# Patient Record
Sex: Female | Born: 1980 | Race: Black or African American | Hispanic: No | Marital: Married | Smoking: Never smoker
Health system: Southern US, Community
[De-identification: ages and names within clinical notes are randomized; demographics above are authoritative.]

## PROBLEM LIST (undated history)

## (undated) DIAGNOSIS — E119 Type 2 diabetes mellitus without complications: Secondary | ICD-10-CM

---
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
## 2000-02-05 ENCOUNTER — Other Ambulatory Visit: Admission: RE | Admit: 2000-02-05 | Discharge: 2000-02-05 | Payer: Self-pay | Admitting: *Deleted

## 2000-05-24 ENCOUNTER — Other Ambulatory Visit: Admission: RE | Admit: 2000-05-24 | Discharge: 2000-05-24 | Payer: Self-pay | Admitting: *Deleted

## 2000-10-13 ENCOUNTER — Other Ambulatory Visit: Admission: RE | Admit: 2000-10-13 | Discharge: 2000-10-13 | Payer: Self-pay | Admitting: *Deleted

## 2001-02-02 ENCOUNTER — Other Ambulatory Visit: Admission: RE | Admit: 2001-02-02 | Discharge: 2001-02-02 | Payer: Self-pay | Admitting: *Deleted

## 2001-07-09 ENCOUNTER — Inpatient Hospital Stay (HOSPITAL_COMMUNITY): Admission: AD | Admit: 2001-07-09 | Discharge: 2001-07-09 | Payer: Self-pay | Admitting: Obstetrics and Gynecology

## 2001-10-25 ENCOUNTER — Other Ambulatory Visit: Admission: RE | Admit: 2001-10-25 | Discharge: 2001-10-25 | Payer: Self-pay | Admitting: Obstetrics & Gynecology

## 2012-06-12 ENCOUNTER — Other Ambulatory Visit: Payer: Self-pay | Admitting: *Deleted

## 2021-03-03 DIAGNOSIS — N96 Recurrent pregnancy loss: Secondary | ICD-10-CM | POA: Diagnosis not present

## 2021-03-03 DIAGNOSIS — E119 Type 2 diabetes mellitus without complications: Secondary | ICD-10-CM | POA: Diagnosis not present

## 2021-03-03 DIAGNOSIS — Z01419 Encounter for gynecological examination (general) (routine) without abnormal findings: Secondary | ICD-10-CM | POA: Diagnosis not present

## 2021-03-03 DIAGNOSIS — Z9889 Other specified postprocedural states: Secondary | ICD-10-CM | POA: Diagnosis not present

## 2021-03-27 DIAGNOSIS — M25532 Pain in left wrist: Secondary | ICD-10-CM | POA: Diagnosis not present

## 2021-03-27 DIAGNOSIS — M545 Low back pain, unspecified: Secondary | ICD-10-CM | POA: Diagnosis not present

## 2021-03-27 DIAGNOSIS — R1084 Generalized abdominal pain: Secondary | ICD-10-CM | POA: Diagnosis not present

## 2021-03-27 DIAGNOSIS — M79642 Pain in left hand: Secondary | ICD-10-CM | POA: Diagnosis not present

## 2021-03-27 DIAGNOSIS — R079 Chest pain, unspecified: Secondary | ICD-10-CM | POA: Diagnosis not present

## 2021-03-27 DIAGNOSIS — M542 Cervicalgia: Secondary | ICD-10-CM | POA: Diagnosis not present

## 2021-03-27 DIAGNOSIS — Z23 Encounter for immunization: Secondary | ICD-10-CM | POA: Diagnosis not present

## 2021-03-27 DIAGNOSIS — M25552 Pain in left hip: Secondary | ICD-10-CM | POA: Diagnosis not present

## 2021-03-27 DIAGNOSIS — Y999 Unspecified external cause status: Secondary | ICD-10-CM | POA: Diagnosis not present

## 2021-03-27 DIAGNOSIS — S79922A Unspecified injury of left thigh, initial encounter: Secondary | ICD-10-CM | POA: Diagnosis not present

## 2021-03-27 DIAGNOSIS — M79622 Pain in left upper arm: Secondary | ICD-10-CM | POA: Diagnosis not present

## 2021-03-27 DIAGNOSIS — S0990XA Unspecified injury of head, initial encounter: Secondary | ICD-10-CM | POA: Diagnosis not present

## 2021-03-27 DIAGNOSIS — M25522 Pain in left elbow: Secondary | ICD-10-CM | POA: Diagnosis not present

## 2021-03-27 DIAGNOSIS — M79632 Pain in left forearm: Secondary | ICD-10-CM | POA: Diagnosis not present

## 2021-03-27 DIAGNOSIS — Y92410 Unspecified street and highway as the place of occurrence of the external cause: Secondary | ICD-10-CM | POA: Diagnosis not present

## 2021-04-10 DIAGNOSIS — H40003 Preglaucoma, unspecified, bilateral: Secondary | ICD-10-CM | POA: Diagnosis not present

## 2021-04-10 DIAGNOSIS — M25512 Pain in left shoulder: Secondary | ICD-10-CM | POA: Diagnosis not present

## 2021-04-10 DIAGNOSIS — E1165 Type 2 diabetes mellitus with hyperglycemia: Secondary | ICD-10-CM | POA: Diagnosis not present

## 2021-04-13 DIAGNOSIS — Z3141 Encounter for fertility testing: Secondary | ICD-10-CM | POA: Diagnosis not present

## 2021-04-13 DIAGNOSIS — Z3202 Encounter for pregnancy test, result negative: Secondary | ICD-10-CM | POA: Diagnosis not present

## 2021-04-22 DIAGNOSIS — M25512 Pain in left shoulder: Secondary | ICD-10-CM | POA: Diagnosis not present

## 2021-04-22 DIAGNOSIS — M542 Cervicalgia: Secondary | ICD-10-CM | POA: Diagnosis not present

## 2021-05-20 DIAGNOSIS — M25512 Pain in left shoulder: Secondary | ICD-10-CM | POA: Diagnosis not present

## 2021-05-20 DIAGNOSIS — M542 Cervicalgia: Secondary | ICD-10-CM | POA: Diagnosis not present

## 2021-06-09 DIAGNOSIS — D251 Intramural leiomyoma of uterus: Secondary | ICD-10-CM | POA: Diagnosis not present

## 2021-06-09 DIAGNOSIS — Z31430 Encounter of female for testing for genetic disease carrier status for procreative management: Secondary | ICD-10-CM | POA: Diagnosis not present

## 2021-06-09 DIAGNOSIS — N856 Intrauterine synechiae: Secondary | ICD-10-CM | POA: Diagnosis not present

## 2021-06-09 DIAGNOSIS — N96 Recurrent pregnancy loss: Secondary | ICD-10-CM | POA: Diagnosis not present

## 2021-06-09 DIAGNOSIS — N979 Female infertility, unspecified: Secondary | ICD-10-CM | POA: Diagnosis not present

## 2021-06-09 DIAGNOSIS — Z319 Encounter for procreative management, unspecified: Secondary | ICD-10-CM | POA: Diagnosis not present

## 2021-06-09 DIAGNOSIS — E559 Vitamin D deficiency, unspecified: Secondary | ICD-10-CM | POA: Diagnosis not present

## 2021-06-26 DIAGNOSIS — M542 Cervicalgia: Secondary | ICD-10-CM | POA: Diagnosis not present

## 2021-06-26 DIAGNOSIS — M25512 Pain in left shoulder: Secondary | ICD-10-CM | POA: Diagnosis not present

## 2021-10-10 ENCOUNTER — Other Ambulatory Visit: Payer: Self-pay

## 2021-10-10 ENCOUNTER — Ambulatory Visit
Admission: EM | Admit: 2021-10-10 | Discharge: 2021-10-10 | Disposition: A | Discharge status: Home / Self Care | Payer: BC Managed Care – PPO | Attending: Urgent Care | Admitting: Urgent Care

## 2021-10-10 ENCOUNTER — Encounter: Payer: Self-pay | Admitting: Urgent Care

## 2021-10-10 DIAGNOSIS — L568 Other specified acute skin changes due to ultraviolet radiation: Secondary | ICD-10-CM | POA: Diagnosis not present

## 2021-10-10 DIAGNOSIS — G43809 Other migraine, not intractable, without status migrainosus: Secondary | ICD-10-CM | POA: Diagnosis not present

## 2021-10-10 HISTORY — DX: Type 2 diabetes mellitus without complications: E11.9

## 2021-10-10 MED ORDER — SUMATRIPTAN SUCCINATE 25 MG PO TABS
ORAL_TABLET | ORAL | 0 refills | Status: DC
Start: 2021-10-10 — End: 2023-04-22
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
## 2021-11-13 DIAGNOSIS — I10 Essential (primary) hypertension: Secondary | ICD-10-CM | POA: Diagnosis not present

## 2021-11-13 DIAGNOSIS — E1165 Type 2 diabetes mellitus with hyperglycemia: Secondary | ICD-10-CM | POA: Diagnosis not present

## 2021-11-13 DIAGNOSIS — E559 Vitamin D deficiency, unspecified: Secondary | ICD-10-CM | POA: Diagnosis not present

## 2021-12-28 DIAGNOSIS — E7800 Pure hypercholesterolemia, unspecified: Secondary | ICD-10-CM | POA: Diagnosis not present

## 2021-12-28 DIAGNOSIS — F064 Anxiety disorder due to known physiological condition: Secondary | ICD-10-CM | POA: Diagnosis not present

## 2021-12-28 DIAGNOSIS — Z20822 Contact with and (suspected) exposure to covid-19: Secondary | ICD-10-CM | POA: Diagnosis not present

## 2021-12-28 DIAGNOSIS — F4312 Post-traumatic stress disorder, chronic: Secondary | ICD-10-CM | POA: Diagnosis not present

## 2021-12-28 DIAGNOSIS — E1169 Type 2 diabetes mellitus with other specified complication: Secondary | ICD-10-CM | POA: Diagnosis not present

## 2021-12-28 DIAGNOSIS — I10 Essential (primary) hypertension: Secondary | ICD-10-CM | POA: Diagnosis not present

## 2022-01-04 ENCOUNTER — Emergency Department (HOSPITAL_BASED_OUTPATIENT_CLINIC_OR_DEPARTMENT_OTHER): Payer: BC Managed Care – PPO

## 2022-01-04 ENCOUNTER — Emergency Department (HOSPITAL_BASED_OUTPATIENT_CLINIC_OR_DEPARTMENT_OTHER): Payer: BC Managed Care – PPO | Admitting: Radiology

## 2022-01-04 ENCOUNTER — Emergency Department (HOSPITAL_BASED_OUTPATIENT_CLINIC_OR_DEPARTMENT_OTHER)
Admission: EM | Admit: 2022-01-04 | Discharge: 2022-01-04 | Disposition: A | Discharge status: Home / Self Care | Payer: BC Managed Care – PPO | Attending: Emergency Medicine | Admitting: Emergency Medicine

## 2022-01-04 ENCOUNTER — Other Ambulatory Visit: Payer: Self-pay

## 2022-01-04 ENCOUNTER — Encounter (HOSPITAL_BASED_OUTPATIENT_CLINIC_OR_DEPARTMENT_OTHER): Payer: Self-pay | Admitting: Obstetrics and Gynecology

## 2022-01-04 DIAGNOSIS — W19XXXA Unspecified fall, initial encounter: Secondary | ICD-10-CM

## 2022-01-04 DIAGNOSIS — M545 Low back pain, unspecified: Secondary | ICD-10-CM

## 2022-01-04 DIAGNOSIS — S39012A Strain of muscle, fascia and tendon of lower back, initial encounter: Secondary | ICD-10-CM

## 2022-01-04 DIAGNOSIS — W1830XA Fall on same level, unspecified, initial encounter: Secondary | ICD-10-CM | POA: Diagnosis not present

## 2022-01-04 DIAGNOSIS — M549 Dorsalgia, unspecified: Secondary | ICD-10-CM | POA: Diagnosis not present

## 2022-01-04 DIAGNOSIS — M25562 Pain in left knee: Secondary | ICD-10-CM | POA: Diagnosis not present

## 2022-01-04 DIAGNOSIS — M25531 Pain in right wrist: Secondary | ICD-10-CM | POA: Diagnosis not present

## 2022-01-04 DIAGNOSIS — R102 Pelvic and perineal pain: Secondary | ICD-10-CM | POA: Diagnosis not present

## 2022-01-04 DIAGNOSIS — S3992XA Unspecified injury of lower back, initial encounter: Secondary | ICD-10-CM | POA: Diagnosis not present

## 2022-01-04 DIAGNOSIS — M533 Sacrococcygeal disorders, not elsewhere classified: Secondary | ICD-10-CM | POA: Diagnosis not present

## 2022-01-04 DIAGNOSIS — N9489 Other specified conditions associated with female genital organs and menstrual cycle: Secondary | ICD-10-CM | POA: Diagnosis not present

## 2022-01-04 DIAGNOSIS — S79919A Unspecified injury of unspecified hip, initial encounter: Secondary | ICD-10-CM | POA: Diagnosis not present

## 2022-01-04 LAB — HCG, QUANTITATIVE, PREGNANCY: hCG, Beta Chain, Quant, S: <1 m[IU]/mL (ref <5)

## 2022-01-04 MED ORDER — LIDOCAINE 5 % EX PTCH: 1.0000 | MEDICATED_PATCH | CUTANEOUS | 0 refills | Status: DC

## 2022-01-04 MED ORDER — HYDROCODONE-ACETAMINOPHEN 5-325 MG PO TABS
1.0000 | ORAL_TABLET | Freq: Once | ORAL | Status: AC
  Administered 2022-01-04: 1 via ORAL
  Filled 2022-01-04: qty 1

## 2022-01-04 MED ORDER — IBUPROFEN 400 MG PO TABS: 400.0000 mg | ORAL_TABLET | Freq: Once | ORAL | Status: DC | PRN

## 2022-01-04 MED ORDER — IBUPROFEN 800 MG PO TABS
800.0000 mg | ORAL_TABLET | Freq: Once | ORAL | Status: AC | PRN
  Administered 2022-01-04: 800 mg via ORAL
  Filled 2022-01-04: qty 1

## 2022-01-04 NOTE — Discharge Instructions (Signed)
Your CT and x-ray imaging was negative for acute fracture or dislocation.  Your symptoms are likely consistent with musculoskeletal strain in your lower lumbar region.  Recommend rest, ice, heating pads, lidocaine patches, NSAIDs for pain control. ?
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
## 2022-01-04 NOTE — ED Notes (Signed)
Discharge paperwork given and understood. 

## 2022-01-04 NOTE — ED Notes (Signed)
Patient returned at this  Time from Imaging. ?

## 2022-01-04 NOTE — ED Triage Notes (Signed)
Patient reports to the ER for a fall at [employer]. Patient reports back pain and tailbone pain as well as right wrist pain. Patient reports she did not hit her head.  ?

## 2022-01-12 DIAGNOSIS — I10 Essential (primary) hypertension: Secondary | ICD-10-CM | POA: Diagnosis not present

## 2022-01-12 DIAGNOSIS — E1165 Type 2 diabetes mellitus with hyperglycemia: Secondary | ICD-10-CM | POA: Diagnosis not present

## 2022-01-12 DIAGNOSIS — F4312 Post-traumatic stress disorder, chronic: Secondary | ICD-10-CM | POA: Diagnosis not present

## 2022-01-29 DIAGNOSIS — Z79899 Other long term (current) drug therapy: Secondary | ICD-10-CM | POA: Diagnosis not present

## 2022-01-29 DIAGNOSIS — G44321 Chronic post-traumatic headache, intractable: Secondary | ICD-10-CM | POA: Diagnosis not present

## 2022-01-29 DIAGNOSIS — Z049 Encounter for examination and observation for unspecified reason: Secondary | ICD-10-CM | POA: Diagnosis not present

## 2022-02-11 DIAGNOSIS — M542 Cervicalgia: Secondary | ICD-10-CM | POA: Diagnosis not present

## 2022-02-11 DIAGNOSIS — M5451 Vertebrogenic low back pain: Secondary | ICD-10-CM | POA: Diagnosis not present

## 2022-02-11 DIAGNOSIS — M9901 Segmental and somatic dysfunction of cervical region: Secondary | ICD-10-CM | POA: Diagnosis not present

## 2022-02-11 DIAGNOSIS — G518 Other disorders of facial nerve: Secondary | ICD-10-CM | POA: Diagnosis not present

## 2022-02-11 DIAGNOSIS — M791 Myalgia, unspecified site: Secondary | ICD-10-CM | POA: Diagnosis not present

## 2022-02-11 DIAGNOSIS — M9905 Segmental and somatic dysfunction of pelvic region: Secondary | ICD-10-CM | POA: Diagnosis not present

## 2022-02-11 DIAGNOSIS — S335XXA Sprain of ligaments of lumbar spine, initial encounter: Secondary | ICD-10-CM | POA: Diagnosis not present

## 2022-02-11 DIAGNOSIS — M9904 Segmental and somatic dysfunction of sacral region: Secondary | ICD-10-CM | POA: Diagnosis not present

## 2022-02-11 DIAGNOSIS — G44321 Chronic post-traumatic headache, intractable: Secondary | ICD-10-CM | POA: Diagnosis not present

## 2022-02-18 DIAGNOSIS — M9904 Segmental and somatic dysfunction of sacral region: Secondary | ICD-10-CM | POA: Diagnosis not present

## 2022-02-18 DIAGNOSIS — M9905 Segmental and somatic dysfunction of pelvic region: Secondary | ICD-10-CM | POA: Diagnosis not present

## 2022-02-18 DIAGNOSIS — M9901 Segmental and somatic dysfunction of cervical region: Secondary | ICD-10-CM | POA: Diagnosis not present

## 2022-02-18 DIAGNOSIS — M5451 Vertebrogenic low back pain: Secondary | ICD-10-CM | POA: Diagnosis not present

## 2022-02-19 DIAGNOSIS — M9904 Segmental and somatic dysfunction of sacral region: Secondary | ICD-10-CM | POA: Diagnosis not present

## 2022-02-19 DIAGNOSIS — M5451 Vertebrogenic low back pain: Secondary | ICD-10-CM | POA: Diagnosis not present

## 2022-02-19 DIAGNOSIS — M9905 Segmental and somatic dysfunction of pelvic region: Secondary | ICD-10-CM | POA: Diagnosis not present

## 2022-02-19 DIAGNOSIS — M9901 Segmental and somatic dysfunction of cervical region: Secondary | ICD-10-CM | POA: Diagnosis not present

## 2022-02-25 DIAGNOSIS — M9904 Segmental and somatic dysfunction of sacral region: Secondary | ICD-10-CM | POA: Diagnosis not present

## 2022-02-25 DIAGNOSIS — M5451 Vertebrogenic low back pain: Secondary | ICD-10-CM | POA: Diagnosis not present

## 2022-02-25 DIAGNOSIS — M9905 Segmental and somatic dysfunction of pelvic region: Secondary | ICD-10-CM | POA: Diagnosis not present

## 2022-02-25 DIAGNOSIS — M9901 Segmental and somatic dysfunction of cervical region: Secondary | ICD-10-CM | POA: Diagnosis not present

## 2022-02-26 DIAGNOSIS — G44321 Chronic post-traumatic headache, intractable: Secondary | ICD-10-CM | POA: Diagnosis not present

## 2022-02-26 DIAGNOSIS — M542 Cervicalgia: Secondary | ICD-10-CM | POA: Diagnosis not present

## 2022-02-26 DIAGNOSIS — M791 Myalgia, unspecified site: Secondary | ICD-10-CM | POA: Diagnosis not present

## 2022-02-26 DIAGNOSIS — M9905 Segmental and somatic dysfunction of pelvic region: Secondary | ICD-10-CM | POA: Diagnosis not present

## 2022-02-26 DIAGNOSIS — M9901 Segmental and somatic dysfunction of cervical region: Secondary | ICD-10-CM | POA: Diagnosis not present

## 2022-02-26 DIAGNOSIS — M9904 Segmental and somatic dysfunction of sacral region: Secondary | ICD-10-CM | POA: Diagnosis not present

## 2022-02-26 DIAGNOSIS — G518 Other disorders of facial nerve: Secondary | ICD-10-CM | POA: Diagnosis not present

## 2022-02-26 DIAGNOSIS — M5451 Vertebrogenic low back pain: Secondary | ICD-10-CM | POA: Diagnosis not present

## 2022-03-02 DIAGNOSIS — M9904 Segmental and somatic dysfunction of sacral region: Secondary | ICD-10-CM | POA: Diagnosis not present

## 2022-03-02 DIAGNOSIS — M9901 Segmental and somatic dysfunction of cervical region: Secondary | ICD-10-CM | POA: Diagnosis not present

## 2022-03-02 DIAGNOSIS — M5451 Vertebrogenic low back pain: Secondary | ICD-10-CM | POA: Diagnosis not present

## 2022-03-02 DIAGNOSIS — M9905 Segmental and somatic dysfunction of pelvic region: Secondary | ICD-10-CM | POA: Diagnosis not present

## 2022-03-03 DIAGNOSIS — M5451 Vertebrogenic low back pain: Secondary | ICD-10-CM | POA: Diagnosis not present

## 2022-03-03 DIAGNOSIS — M9904 Segmental and somatic dysfunction of sacral region: Secondary | ICD-10-CM | POA: Diagnosis not present

## 2022-03-03 DIAGNOSIS — M9901 Segmental and somatic dysfunction of cervical region: Secondary | ICD-10-CM | POA: Diagnosis not present

## 2022-03-03 DIAGNOSIS — M9905 Segmental and somatic dysfunction of pelvic region: Secondary | ICD-10-CM | POA: Diagnosis not present

## 2022-03-04 DIAGNOSIS — M9904 Segmental and somatic dysfunction of sacral region: Secondary | ICD-10-CM | POA: Diagnosis not present

## 2022-03-04 DIAGNOSIS — M9905 Segmental and somatic dysfunction of pelvic region: Secondary | ICD-10-CM | POA: Diagnosis not present

## 2022-03-04 DIAGNOSIS — M5451 Vertebrogenic low back pain: Secondary | ICD-10-CM | POA: Diagnosis not present

## 2022-03-04 DIAGNOSIS — M9901 Segmental and somatic dysfunction of cervical region: Secondary | ICD-10-CM | POA: Diagnosis not present

## 2022-03-09 DIAGNOSIS — S335XXA Sprain of ligaments of lumbar spine, initial encounter: Secondary | ICD-10-CM | POA: Diagnosis not present

## 2022-03-09 DIAGNOSIS — M9904 Segmental and somatic dysfunction of sacral region: Secondary | ICD-10-CM | POA: Diagnosis not present

## 2022-03-09 DIAGNOSIS — M9905 Segmental and somatic dysfunction of pelvic region: Secondary | ICD-10-CM | POA: Diagnosis not present

## 2022-03-09 DIAGNOSIS — M9901 Segmental and somatic dysfunction of cervical region: Secondary | ICD-10-CM | POA: Diagnosis not present

## 2022-03-09 DIAGNOSIS — M9902 Segmental and somatic dysfunction of thoracic region: Secondary | ICD-10-CM | POA: Diagnosis not present

## 2022-03-09 DIAGNOSIS — M5451 Vertebrogenic low back pain: Secondary | ICD-10-CM | POA: Diagnosis not present

## 2022-03-09 DIAGNOSIS — S336XXA Sprain of sacroiliac joint, initial encounter: Secondary | ICD-10-CM | POA: Diagnosis not present

## 2022-03-10 DIAGNOSIS — M791 Myalgia, unspecified site: Secondary | ICD-10-CM | POA: Diagnosis not present

## 2022-03-10 DIAGNOSIS — M9901 Segmental and somatic dysfunction of cervical region: Secondary | ICD-10-CM | POA: Diagnosis not present

## 2022-03-10 DIAGNOSIS — M9905 Segmental and somatic dysfunction of pelvic region: Secondary | ICD-10-CM | POA: Diagnosis not present

## 2022-03-10 DIAGNOSIS — G44321 Chronic post-traumatic headache, intractable: Secondary | ICD-10-CM | POA: Diagnosis not present

## 2022-03-10 DIAGNOSIS — M542 Cervicalgia: Secondary | ICD-10-CM | POA: Diagnosis not present

## 2022-03-10 DIAGNOSIS — M5451 Vertebrogenic low back pain: Secondary | ICD-10-CM | POA: Diagnosis not present

## 2022-03-10 DIAGNOSIS — G518 Other disorders of facial nerve: Secondary | ICD-10-CM | POA: Diagnosis not present

## 2022-03-10 DIAGNOSIS — M9904 Segmental and somatic dysfunction of sacral region: Secondary | ICD-10-CM | POA: Diagnosis not present

## 2022-03-12 DIAGNOSIS — M9901 Segmental and somatic dysfunction of cervical region: Secondary | ICD-10-CM | POA: Diagnosis not present

## 2022-03-12 DIAGNOSIS — M9904 Segmental and somatic dysfunction of sacral region: Secondary | ICD-10-CM | POA: Diagnosis not present

## 2022-03-12 DIAGNOSIS — M5451 Vertebrogenic low back pain: Secondary | ICD-10-CM | POA: Diagnosis not present

## 2022-03-12 DIAGNOSIS — M9905 Segmental and somatic dysfunction of pelvic region: Secondary | ICD-10-CM | POA: Diagnosis not present

## 2022-03-25 DIAGNOSIS — M9901 Segmental and somatic dysfunction of cervical region: Secondary | ICD-10-CM | POA: Diagnosis not present

## 2022-03-25 DIAGNOSIS — M5451 Vertebrogenic low back pain: Secondary | ICD-10-CM | POA: Diagnosis not present

## 2022-03-25 DIAGNOSIS — M9904 Segmental and somatic dysfunction of sacral region: Secondary | ICD-10-CM | POA: Diagnosis not present

## 2022-03-25 DIAGNOSIS — M9905 Segmental and somatic dysfunction of pelvic region: Secondary | ICD-10-CM | POA: Diagnosis not present

## 2022-03-26 DIAGNOSIS — M9904 Segmental and somatic dysfunction of sacral region: Secondary | ICD-10-CM | POA: Diagnosis not present

## 2022-03-26 DIAGNOSIS — M9905 Segmental and somatic dysfunction of pelvic region: Secondary | ICD-10-CM | POA: Diagnosis not present

## 2022-03-26 DIAGNOSIS — M9901 Segmental and somatic dysfunction of cervical region: Secondary | ICD-10-CM | POA: Diagnosis not present

## 2022-03-26 DIAGNOSIS — M5451 Vertebrogenic low back pain: Secondary | ICD-10-CM | POA: Diagnosis not present

## 2022-03-29 DIAGNOSIS — G518 Other disorders of facial nerve: Secondary | ICD-10-CM | POA: Diagnosis not present

## 2022-03-29 DIAGNOSIS — M791 Myalgia, unspecified site: Secondary | ICD-10-CM | POA: Diagnosis not present

## 2022-03-29 DIAGNOSIS — M542 Cervicalgia: Secondary | ICD-10-CM | POA: Diagnosis not present

## 2022-03-29 DIAGNOSIS — G44321 Chronic post-traumatic headache, intractable: Secondary | ICD-10-CM | POA: Diagnosis not present

## 2022-03-31 DIAGNOSIS — M546 Pain in thoracic spine: Secondary | ICD-10-CM | POA: Diagnosis not present

## 2022-03-31 DIAGNOSIS — M9901 Segmental and somatic dysfunction of cervical region: Secondary | ICD-10-CM | POA: Diagnosis not present

## 2022-03-31 DIAGNOSIS — M5451 Vertebrogenic low back pain: Secondary | ICD-10-CM | POA: Diagnosis not present

## 2022-03-31 DIAGNOSIS — M9905 Segmental and somatic dysfunction of pelvic region: Secondary | ICD-10-CM | POA: Diagnosis not present

## 2022-04-01 DIAGNOSIS — M9902 Segmental and somatic dysfunction of thoracic region: Secondary | ICD-10-CM | POA: Diagnosis not present

## 2022-04-01 DIAGNOSIS — M546 Pain in thoracic spine: Secondary | ICD-10-CM | POA: Diagnosis not present

## 2022-04-01 DIAGNOSIS — M9901 Segmental and somatic dysfunction of cervical region: Secondary | ICD-10-CM | POA: Diagnosis not present

## 2022-04-01 DIAGNOSIS — M9905 Segmental and somatic dysfunction of pelvic region: Secondary | ICD-10-CM | POA: Diagnosis not present

## 2022-04-16 DIAGNOSIS — G44321 Chronic post-traumatic headache, intractable: Secondary | ICD-10-CM | POA: Diagnosis not present

## 2022-04-16 DIAGNOSIS — M791 Myalgia, unspecified site: Secondary | ICD-10-CM | POA: Diagnosis not present

## 2022-04-16 DIAGNOSIS — M542 Cervicalgia: Secondary | ICD-10-CM | POA: Diagnosis not present

## 2022-04-16 DIAGNOSIS — G518 Other disorders of facial nerve: Secondary | ICD-10-CM | POA: Diagnosis not present

## 2022-05-02 ENCOUNTER — Ambulatory Visit
Admission: EM | Admit: 2022-05-02 | Discharge: 2022-05-02 | Disposition: A | Discharge status: Home / Self Care | Payer: BC Managed Care – PPO | Attending: Physician Assistant | Admitting: Physician Assistant

## 2022-05-02 ENCOUNTER — Encounter: Payer: Self-pay | Admitting: Emergency Medicine

## 2022-05-02 ENCOUNTER — Other Ambulatory Visit: Payer: Self-pay

## 2022-05-02 DIAGNOSIS — J039 Acute tonsillitis, unspecified: Secondary | ICD-10-CM | POA: Diagnosis not present

## 2022-05-02 LAB — POCT RAPID STREP A (OFFICE): Rapid Strep A Screen: NEGATIVE

## 2022-05-02 LAB — POCT MONO SCREEN (KUC): Mono, POC: NEGATIVE

## 2022-05-02 MED ORDER — AMOXICILLIN-POT CLAVULANATE 875-125 MG PO TABS: 1.0000 | ORAL_TABLET | Freq: Two times a day (BID) | ORAL | 0 refills | Status: DC

## 2022-05-02 NOTE — Discharge Instructions (Signed)
Start antibiotic as prescribed.  Gargle with warm salt water.  Alternate Tylenol and ibuprofen for pain.  If your symptoms are not improving quickly please return for reevaluation.  If you have any swelling of your throat, inability to swallow, muffled voice, shortness of breath, nausea/vomiting, fever not responding medication you need to be seen immediately.  If you develop any rash with the medication you should be seen immediately.

## 2022-05-02 NOTE — ED Provider Notes (Signed)
[facility]    CSN: [id number] Arrival date & time: 05/02/22  3154      History   Chief Complaint Chief Complaint  Patient presents with   Sore Throat    HPI Alexandra Ortiz is a 41 y.o. female.   Patient presents today with a 5-day history of very severe sore throat.  Reports that this is worse on the right but does involve the entirety of her posterior oropharynx.  Pain is rated 8 on a 0-10 pain scale but increases to 10 with 10 with attempted swallowing, described as a sharp, no aggravating or alleviating factors identified.  Denies any associated cough, congestion, fever, nausea, vomiting.  She has been using throat lozenges, throat spray, Tylenol, ibuprofen without improvement.  Does report husband had similar symptoms but has resolved quickly.  She is able to eat and drink despite symptoms.  Denies any swelling of her throat, shortness of breath, muffled voice.  Denies any recent antibiotic or steroid use.  She is confident she is not pregnant.    Past Medical History:  Diagnosis Date   Diabetes mellitus without complication ([facility])     There are no problems to display for this patient.   History reviewed. No pertinent surgical history.  OB History   No obstetric history on file.      Home Medications    Prior to Admission medications   Medication Sig Start Date End Date Taking? Authorizing Provider  amoxicillin-clavulanate (AUGMENTIN) 875-125 MG tablet Take 1 tablet by mouth every 12 (twelve) hours. 05/02/22  Yes Oral Remache K, PA-C  lidocaine (LIDODERM) 5 % Place 1 patch onto the skin daily. Remove & Discard patch within 12 hours or as directed by MD 01/04/22   Ernie Avena, MD  SUMAtriptan (IMITREX) 25 MG tablet Take 1 tablet at the start of a migraine. If the pain persists after 2 hours take 1 more tablet. Do not exceed 2 doses in 24 hours. 10/10/21   Wallis Bamberg, PA-C    Family History Family History  Problem Relation Age of Onset   Diabetes  Mother    Diabetes Father    Hypertension Father    Heart failure Father     Social History Social History   Tobacco Use   Smoking status: Never    Passive exposure: Past   Smokeless tobacco: Never  Vaping Use   Vaping Use: Never used  Substance Use Topics   Alcohol use: Not Currently   Drug use: Yes    Types: Marijuana     Allergies   Tape   Review of Systems Review of Systems  Constitutional:  Positive for activity change. Negative for appetite change, fatigue and fever.  HENT:  Positive for sore throat and trouble swallowing. Negative for congestion, sinus pressure, sneezing and voice change.   Respiratory:  Negative for cough and shortness of breath.   Cardiovascular:  Negative for chest pain.  Gastrointestinal:  Negative for abdominal pain, diarrhea, nausea and vomiting.     Physical Exam Triage Vital Signs ED Triage Vitals [05/02/22 1109]  Enc Vitals Group     BP (!) 140/83     Pulse Rate 96     Resp 20     Temp 100 F (37.8 C)     Temp Source Oral     SpO2 99 %     Weight      Height      Head Circumference      Peak Flow  Pain Score 8     Pain Loc      Pain Edu?      Excl. in GC?    No data found.  Updated Vital Signs BP (!) 140/83 (BP Location: Right Arm)   Pulse 96   Temp 100 F (37.8 C) (Oral)   Resp 20   LMP 04/03/2022 (Approximate)   SpO2 99%   Visual Acuity Right Eye Distance:   Left Eye Distance:   Bilateral Distance:    Right Eye Near:   Left Eye Near:    Bilateral Near:     Physical Exam Vitals reviewed.  Constitutional:      General: She is awake. She is not in acute distress.    Appearance: Normal appearance. She is well-developed. She is not ill-appearing.     Comments: Very pleasant female appears stated age in no acute distress sitting comfortably in exam room  HENT:     Head: Normocephalic and atraumatic.     Right Ear: Tympanic membrane, ear canal and external ear normal. Tympanic membrane is not  erythematous or bulging.     Left Ear: Tympanic membrane, ear canal and external ear normal. Tympanic membrane is not erythematous or bulging.     Nose:     Right Sinus: No maxillary sinus tenderness or frontal sinus tenderness.     Left Sinus: No maxillary sinus tenderness or frontal sinus tenderness.     Mouth/Throat:     Pharynx: Uvula midline. Posterior oropharyngeal erythema present. No oropharyngeal exudate.     Tonsils: Tonsillar exudate present. No tonsillar abscesses. 1+ on the right. 1+ on the left.  Cardiovascular:     Rate and Rhythm: Normal rate and regular rhythm.     Heart sounds: Normal heart sounds, S1 normal and S2 normal. No murmur heard. Pulmonary:     Effort: Pulmonary effort is normal.     Breath sounds: Normal breath sounds. No wheezing, rhonchi or rales.     Comments: Clear to auscultation bilaterally Psychiatric:        Behavior: Behavior is cooperative.      UC Treatments / Results  Labs (all labs ordered are listed, but only abnormal results are displayed) Labs Reviewed  POCT RAPID STREP A (OFFICE)  POCT MONO SCREEN ([facility])    EKG   Radiology No results found.  Procedures Procedures (including critical care time)  Medications Ordered in UC Medications - No data to display  Initial Impression / Assessment and Plan / UC Course  I have reviewed the triage vital signs and the nursing notes.  Pertinent labs & imaging results that were available during my care of the patient were reviewed by me and considered in my medical decision making (see chart for details).     Strep negative in clinic today.  Mono was also negative.  Discussed that it is possible that she could have a false negative mono and if she develops any rash she should stop the medication and be seen immediately.  Given exudate with significant pain and worsening symptoms will cover with Augmentin for bacterial etiology.  Throat culture was deferred given empiric treatment.   Recommended she use gargling with warm salt water as well as Tylenol and ibuprofen for pain relief.  She is to rest and drink plenty of fluid.  Discussed that if her symptoms are not improving and she has difficulty swallowing, swelling of her throat, fever, nausea, vomiting she needs to be seen immediately.  Strict return precautions given.  Work excuse note provided.  Final Clinical Impressions(s) / UC Diagnoses   Final diagnoses:  Exudative tonsillitis     Discharge Instructions      Start antibiotic as prescribed.  Gargle with warm salt water.  Alternate Tylenol and ibuprofen for pain.  If your symptoms are not improving quickly please return for reevaluation.  If you have any swelling of your throat, inability to swallow, muffled voice, shortness of breath, nausea/vomiting, fever not responding medication you need to be seen immediately.  If you develop any rash with the medication you should be seen immediately.     ED Prescriptions     Medication Sig Dispense Auth. Provider   amoxicillin-clavulanate (AUGMENTIN) 875-125 MG tablet Take 1 tablet by mouth every 12 (twelve) hours. 14 tablet Jahnessa Vanduyn, Noberto Retort, PA-C      PDMP not reviewed this encounter.   Jeani Hawking, PA-C 05/02/22 1216

## 2022-05-02 NOTE — ED Triage Notes (Signed)
Pt reports intense sore throat since last Tuesday. Pt reports has tried otc lozenges, sprays, teas,etc. with no change in symptoms. Pt reports husband had something similar last month but denies any exposure with similar symptoms.

## 2022-05-14 DIAGNOSIS — M791 Myalgia, unspecified site: Secondary | ICD-10-CM | POA: Diagnosis not present

## 2022-05-14 DIAGNOSIS — G518 Other disorders of facial nerve: Secondary | ICD-10-CM | POA: Diagnosis not present

## 2022-05-14 DIAGNOSIS — M542 Cervicalgia: Secondary | ICD-10-CM | POA: Diagnosis not present

## 2022-05-14 DIAGNOSIS — G44321 Chronic post-traumatic headache, intractable: Secondary | ICD-10-CM | POA: Diagnosis not present

## 2022-05-15 DIAGNOSIS — E119 Type 2 diabetes mellitus without complications: Secondary | ICD-10-CM | POA: Diagnosis not present

## 2022-06-01 DIAGNOSIS — Z319 Encounter for procreative management, unspecified: Secondary | ICD-10-CM | POA: Diagnosis not present

## 2022-06-01 DIAGNOSIS — N979 Female infertility, unspecified: Secondary | ICD-10-CM | POA: Diagnosis not present

## 2022-06-01 DIAGNOSIS — E559 Vitamin D deficiency, unspecified: Secondary | ICD-10-CM | POA: Diagnosis not present

## 2022-06-01 DIAGNOSIS — D251 Intramural leiomyoma of uterus: Secondary | ICD-10-CM | POA: Diagnosis not present

## 2022-06-01 DIAGNOSIS — E119 Type 2 diabetes mellitus without complications: Secondary | ICD-10-CM | POA: Diagnosis not present

## 2022-06-01 DIAGNOSIS — N856 Intrauterine synechiae: Secondary | ICD-10-CM | POA: Diagnosis not present

## 2022-06-21 DIAGNOSIS — M791 Myalgia, unspecified site: Secondary | ICD-10-CM | POA: Diagnosis not present

## 2022-06-21 DIAGNOSIS — G44321 Chronic post-traumatic headache, intractable: Secondary | ICD-10-CM | POA: Diagnosis not present

## 2022-06-21 DIAGNOSIS — G518 Other disorders of facial nerve: Secondary | ICD-10-CM | POA: Diagnosis not present

## 2022-06-21 DIAGNOSIS — M542 Cervicalgia: Secondary | ICD-10-CM | POA: Diagnosis not present

## 2022-07-12 DIAGNOSIS — J019 Acute sinusitis, unspecified: Secondary | ICD-10-CM | POA: Diagnosis not present

## 2022-07-12 DIAGNOSIS — J029 Acute pharyngitis, unspecified: Secondary | ICD-10-CM | POA: Diagnosis not present

## 2022-08-09 DIAGNOSIS — G518 Other disorders of facial nerve: Secondary | ICD-10-CM | POA: Diagnosis not present

## 2022-08-09 DIAGNOSIS — G44321 Chronic post-traumatic headache, intractable: Secondary | ICD-10-CM | POA: Diagnosis not present

## 2022-08-09 DIAGNOSIS — M542 Cervicalgia: Secondary | ICD-10-CM | POA: Diagnosis not present

## 2022-08-09 DIAGNOSIS — M791 Myalgia, unspecified site: Secondary | ICD-10-CM | POA: Diagnosis not present
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
## 2022-10-15 ENCOUNTER — Other Ambulatory Visit: Payer: Self-pay | Admitting: Family Medicine

## 2022-10-15 DIAGNOSIS — Z1231 Encounter for screening mammogram for malignant neoplasm of breast: Secondary | ICD-10-CM

## 2022-10-25 ENCOUNTER — Other Ambulatory Visit: Payer: Self-pay

## 2022-10-25 ENCOUNTER — Encounter (HOSPITAL_COMMUNITY): Payer: Self-pay

## 2022-10-25 ENCOUNTER — Ambulatory Visit (HOSPITAL_COMMUNITY)
Admission: RE | Admit: 2022-10-25 | Discharge: 2022-10-25 | Disposition: A | Discharge status: Home / Self Care | Payer: BC Managed Care – PPO | Source: Ambulatory Visit | Attending: Emergency Medicine | Admitting: Emergency Medicine

## 2022-10-25 VITALS — BP 116/77 | HR 79 | Temp 98.0°F | Resp 20

## 2022-10-25 DIAGNOSIS — M5442 Lumbago with sciatica, left side: Secondary | ICD-10-CM

## 2022-10-25 MED ORDER — PREDNISONE 20 MG PO TABS: 40.0000 mg | ORAL_TABLET | Freq: Every day | ORAL | 0 refills | Status: AC

## 2022-10-25 MED ORDER — CYCLOBENZAPRINE HCL 5 MG PO TABS: 5.0000 mg | ORAL_TABLET | Freq: Every evening | ORAL | 0 refills | Status: DC

## 2022-10-25 NOTE — ED Triage Notes (Signed)
Pt presents today at the request of her insurance company. Pt fell 01-04-22 and has hip pain due to fall. Pt reports insurance company wants x-rays to make sure hip is healing.

## 2022-10-25 NOTE — ED Provider Notes (Signed)
[facility]    CSN: [id number] Arrival date & time: 10/25/22  1410      History   Chief Complaint Chief Complaint  Patient presents with   Follow-up    Entered by patient   Hip Pain    HPI Alexandra Ortiz is a 42 y.o. female.  Here at the request of her attorney  She fell on 01/04/2022 at a [place]. In the ED she had pelvic xray with concern for small fracture, but follow up CT scan was negative  Attorney was requesting repeat xray to evaluate for "healing of the fracture" as they did not know CT scan had ruled out fracture  Patient reports left low back/buttock pain. It radiates into her left leg. Worse with extension of back. Denies new injury or trauma since the fall She has tried ibuprofen and tylenol Used muscle relaxer once but made her groggy Saw a chiropractor a few times  Past Medical History:  Diagnosis Date   Diabetes mellitus without complication ([place])     There are no problems to display for this patient.   History reviewed. No pertinent surgical history.  OB History   No obstetric history on file.      Home Medications    Prior to Admission medications   Medication Sig Start Date End Date Taking? Authorizing Provider  cyclobenzaprine (FLEXERIL) 5 MG tablet Take 1 tablet (5 mg total) by mouth at bedtime. 10/25/22  Yes Nolawi Kanady, Wells Guiles, PA-C  predniSONE (DELTASONE) 20 MG tablet Take 2 tablets (40 mg total) by mouth daily with breakfast for 5 days. 10/25/22 10/30/22 Yes Shulem Mader, PA-C  lidocaine (LIDODERM) 5 % Place 1 patch onto the skin daily. Remove & Discard patch within 12 hours or as directed by MD 01/04/22   Regan Lemming, MD  SUMAtriptan (IMITREX) 25 MG tablet Take 1 tablet at the start of a migraine. If the pain persists after 2 hours take 1 more tablet. Do not exceed 2 doses in 24 hours. 10/10/21   Jaynee Eagles, PA-C    Family History Family History  Problem Relation Age of Onset   Diabetes Mother    Diabetes Father     Hypertension Father    Heart failure Father     Social History Social History   Tobacco Use   Smoking status: Never    Passive exposure: Past   Smokeless tobacco: Never  Vaping Use   Vaping Use: Never used  Substance Use Topics   Alcohol use: Not Currently   Drug use: Yes    Types: Marijuana     Allergies   Tape   Review of Systems Review of Systems As per HPI  Physical Exam Triage Vital Signs ED Triage Vitals  Enc Vitals Group     BP 10/25/22 1456 116/77     Pulse Rate 10/25/22 1456 79     Resp 10/25/22 1456 20     Temp 10/25/22 1456 98 F (36.7 C)     Temp src --      SpO2 10/25/22 1456 98 %     Weight --      Height --      Head Circumference --      Peak Flow --      Pain Score 10/25/22 1454 5     Pain Loc --      Pain Edu? --      Excl. in [place]? --    No data found.  Updated Vital Signs BP 116/77  Pulse 79   Temp 98 F (36.7 C)   Resp 20   LMP 10/12/2022   SpO2 98%    Physical Exam Vitals and nursing note reviewed.  Constitutional:      General: She is not in acute distress.    Comments: Standing in clinic, sitting worsens pain  HENT:     Mouth/Throat:     Pharynx: Oropharynx is clear.  Cardiovascular:     Rate and Rhythm: Normal rate and regular rhythm.  Pulmonary:     Effort: Pulmonary effort is normal.  Musculoskeletal:        General: Normal range of motion.     Cervical back: Normal range of motion.       Legs:     Comments: Pain located Left low back into glute, will radiate to heel with certain movement. No bony tenderness of the spine  Neurological:     Mental Status: She is alert and oriented to person, place, and time.     Gait: Gait normal.     UC Treatments / Results  Labs (all labs ordered are listed, but only abnormal results are displayed) Labs Reviewed - No data to display  EKG  Radiology No results found.  Procedures Procedures (including critical care time)  Medications Ordered in UC Medications -  No data to display  Initial Impression / Assessment and Plan / UC Course  I have reviewed the triage vital signs and the nursing notes.  Pertinent labs & imaging results that were available during my care of the patient were reviewed by me and considered in my medical decision making (see chart for details).  Discussed there was no fracture or bony abnormality on her imaging after the fall, therefore no reason to repeat imaging today as there would not be any change. Discussed results of previous CT scan. Patient verbalizes understanding Discussed likely sciatica. Recommend short course prednisone. We will try half dose ('5mg'$ ) flexeril just at night as she has felt groggy with full dose Provided orthopedic info for follow up as well. Work note provided  Return precautions discussed. Patient agrees to plan She does have a PCP visit in 2 weeks as well  Final Clinical Impressions(s) / UC Diagnoses   Final diagnoses:  Acute left-sided low back pain with left-sided sciatica     Discharge Instructions      Try the 5 mg dose of flexeril (muscle relaxer) at bedtime  Prednisone - once a day for 5 days to reduce inflammation  Please see the orthopedic specialists for further evaluation      ED Prescriptions     Medication Sig Dispense Auth. Provider   cyclobenzaprine (FLEXERIL) 5 MG tablet Take 1 tablet (5 mg total) by mouth at bedtime. 20 tablet Anyeli Hockenbury, PA-C   predniSONE (DELTASONE) 20 MG tablet Take 2 tablets (40 mg total) by mouth daily with breakfast for 5 days. 10 tablet Ashika Apuzzo, Wells Guiles, PA-C      PDMP not reviewed this encounter.   [place], [place] 10/25/22 1607

## 2022-10-25 NOTE — Discharge Instructions (Addendum)
Try the 5 mg dose of flexeril (muscle relaxer) at bedtime  Prednisone - once a day for 5 days to reduce inflammation  Please see the orthopedic specialists for further evaluation

## 2022-11-08 ENCOUNTER — Ambulatory Visit (INDEPENDENT_AMBULATORY_CARE_PROVIDER_SITE_OTHER): Payer: BC Managed Care – PPO | Admitting: Family Medicine

## 2022-11-08 ENCOUNTER — Encounter: Payer: Self-pay | Admitting: Family Medicine

## 2022-11-08 VITALS — BP 112/75 | HR 87 | Ht 62.0 in | Wt 228.0 lb

## 2022-11-08 DIAGNOSIS — E038 Other specified hypothyroidism: Secondary | ICD-10-CM | POA: Diagnosis not present

## 2022-11-08 DIAGNOSIS — Z114 Encounter for screening for human immunodeficiency virus [HIV]: Secondary | ICD-10-CM | POA: Diagnosis not present

## 2022-11-08 DIAGNOSIS — Z1159 Encounter for screening for other viral diseases: Secondary | ICD-10-CM

## 2022-11-08 DIAGNOSIS — M25552 Pain in left hip: Secondary | ICD-10-CM | POA: Diagnosis not present

## 2022-11-08 DIAGNOSIS — E119 Type 2 diabetes mellitus without complications: Secondary | ICD-10-CM | POA: Insufficient documentation

## 2022-11-08 DIAGNOSIS — F419 Anxiety disorder, unspecified: Secondary | ICD-10-CM | POA: Diagnosis not present

## 2022-11-08 DIAGNOSIS — E1100 Type 2 diabetes mellitus with hyperosmolarity without nonketotic hyperglycemic-hyperosmolar coma (NKHHC): Secondary | ICD-10-CM | POA: Diagnosis not present

## 2022-11-08 DIAGNOSIS — E7849 Other hyperlipidemia: Secondary | ICD-10-CM

## 2022-11-08 DIAGNOSIS — F32A Depression, unspecified: Secondary | ICD-10-CM

## 2022-11-08 LAB — POCT GLYCOSYLATED HEMOGLOBIN (HGB A1C): Hemoglobin A1C: 12.6 % — AB (ref 4.0–5.6)

## 2022-11-08 MED ORDER — SEMAGLUTIDE(0.25 OR 0.5MG/DOS) 2 MG/3ML ~~LOC~~ SOPN: 0.2500 mg | PEN_INJECTOR | SUBCUTANEOUS | 0 refills | Status: DC

## 2022-11-08 NOTE — Assessment & Plan Note (Signed)
History of anxiety and depression PHQ-9 is 7 Patient is tearful today in the clinic Denies suicidal thoughts and ideation She reports the loss of her niece and her father in December 2023 Patient is still grieving We will place a referral and to integrated behavioral health for talk therapy Will follow-up in 4 weeks to assess patient's condition If talk therapy is ineffective, will consider adding patient on antidepressants

## 2022-11-08 NOTE — Progress Notes (Signed)
New Patient Office Visit  Subjective:  Patient ID: Alexandra Ortiz, female    DOB: 1981/08/15  Age: 42 y.o. MRN: [id number]  CC:  Chief Complaint  Patient presents with   Establish Care   Depression    HPI Alexandra Ortiz is a 42 y.o. female with past medical history of anxiety and depression, type 2 diabetes presents for establishing care. For the details of today's visit, please refer to the assessment and plan.     Past Medical History:  Diagnosis Date   Diabetes mellitus without complication ([place])     No past surgical history on file.  Family History  Problem Relation Age of Onset   Diabetes Mother    Diabetes Father    Hypertension Father    Heart failure Father     Social History   Socioeconomic History   Marital status: Married    Spouse name: Not on file   Number of children: Not on file   Years of education: Not on file   Highest education level: Not on file  Occupational History   Not on file  Tobacco Use   Smoking status: Never    Passive exposure: Past   Smokeless tobacco: Never  Vaping Use   Vaping Use: Never used  Substance and Sexual Activity   Alcohol use: Not Currently   Drug use: Yes    Types: Marijuana   Sexual activity: Yes  Other Topics Concern   Not on file  Social History Narrative   ** Merged History Encounter **       Social Determinants of Health   Financial Resource Strain: Not on file  Food Insecurity: Not on file  Transportation Needs: Not on file  Physical Activity: Not on file  Stress: Not on file  Social Connections: Not on file  Intimate Partner Violence: Not on file    ROS Review of Systems  Constitutional:  Negative for chills and fever.  Eyes:  Negative for visual disturbance.  Respiratory:  Negative for chest tightness and shortness of breath.   Endocrine: Positive for polyuria. Negative for polydipsia and polyphagia.  Neurological:  Negative for dizziness and headaches.    Objective:   Today's  Vitals: BP 112/75   Pulse 87   Ht 5\' 2"  (1.575 m)   Wt 228 lb (103.4 kg)   LMP 10/12/2022   SpO2 96%   BMI 41.70 kg/m   Physical Exam HENT:     Head: Normocephalic.     Mouth/Throat:     Mouth: Mucous membranes are moist.  Cardiovascular:     Rate and Rhythm: Normal rate.     Heart sounds: Normal heart sounds.  Pulmonary:     Effort: Pulmonary effort is normal.     Breath sounds: Normal breath sounds.  Neurological:     Mental Status: She is alert.      Assessment & Plan:   Type 2 diabetes mellitus with hyperosmolarity without coma, unspecified whether long term insulin use (HCC) -     CBC with Differential/Platelet -     CMP14+EGFR -     Microalbumin / creatinine urine ratio -     POCT glycosylated hemoglobin (Hb A1C) -     Semaglutide(0.25 or 0.5MG /DOS); Inject 0.25 mg into the skin once a week.  Dispense: 3 mL; Refill: 0  Anxiety and depression Assessment & Plan: History of anxiety and depression PHQ-9 is 7 Patient is tearful today in the clinic Denies suicidal thoughts and ideation She reports the  loss of her niece and her father in December 2023 Patient is still grieving We will place a referral and to integrated behavioral health for talk therapy Will follow-up in 4 weeks to assess patient's condition If talk therapy is ineffective, will consider adding patient on antidepressants   Orders: -     Amb ref to [place]  Need for hepatitis C screening test -     Hepatitis C antibody  Encounter for screening for HIV -     HIV Antibody (routine testing w rflx)  Other specified hypothyroidism -     TSH + free T4  Other hyperlipidemia -     Lipid panel     Follow-up: Return in about 4 weeks (around 12/06/2022) for anxiety and depression.   Alvira Monday, FNP

## 2022-11-08 NOTE — Assessment & Plan Note (Signed)
History of type 2 diabetes Reported she has been out of her medication for a few months She reports noncompliance with oral antidiabetic medication She was on Ozempic 1 mg weekly subcu POCT hemoglobin A1c today in the clinic is 12.6 Will reinstate Ozempic at the starting dose and increase every 4 weeks Will add oral antidiabetic medication to her treatment regimen after assessing her kidney function Patient verbalized understanding

## 2022-11-08 NOTE — Patient Instructions (Addendum)
I appreciate the opportunity to provide care to you today!    Follow up:  4 weeks for depression  Labs: please stop by the lab during the week to get your blood drawn (CBC, CMP, TSH, Lipid profile, Vit D)  Screening: HIV and Hep C   Type 2 diabetes Your hemoglobin A1c in the clinic is 12.1 I have reinstated your Ozempic at the lowest dose 0.25 mg subcu weekly Will increase Ozempic after 4 weeks of therapy to 0.5 mg weekly Please send me a message when you need refills I will look at your labs before starting you on oral medication for your type 2 diabetes I recommend increasing physical activity and adhering to a heart healthy diet I recommend avoiding simple carbohydrates, including cakes, sweet desserts, ice cream, soda (diet or regular), sweet tea, candies, chips, cookies, store-bought juices, alcohol in excess of 1-2 drinks a day, lemonade, artificial sweeteners, donuts, coffee creamers, and sugar-free products.  I recommend avoiding greasy, fatty foods with increased physical activity.   Anxiety and depression Referral was placed to integrated behavioral health for talk therapy We will follow-up in 4 weeks to assess how you are doing If talk therapy is ineffective, we can start you on antidepressants   Referrals today- Integrated behavioral health for talk therapy    Please continue to a heart-healthy diet and increase your physical activities. Try to exercise for 70mins at least five days a week.      It was a pleasure to see you and I look forward to continuing to work together on your health and well-being. Please do not hesitate to call the office if you need care or have questions about your care.   Have a wonderful day and week. With Gratitude, Alvira Monday MSN, FNP-BC

## 2022-11-15 ENCOUNTER — Encounter: Payer: Self-pay | Admitting: Licensed Clinical Social Worker

## 2022-11-18 ENCOUNTER — Other Ambulatory Visit: Payer: Self-pay | Admitting: Family Medicine

## 2022-11-18 DIAGNOSIS — E1100 Type 2 diabetes mellitus with hyperosmolarity without nonketotic hyperglycemic-hyperosmolar coma (NKHHC): Secondary | ICD-10-CM

## 2022-11-22 ENCOUNTER — Other Ambulatory Visit: Payer: Self-pay | Admitting: Family Medicine

## 2022-11-22 DIAGNOSIS — E1100 Type 2 diabetes mellitus with hyperosmolarity without nonketotic hyperglycemic-hyperosmolar coma (NKHHC): Secondary | ICD-10-CM

## 2022-11-22 MED ORDER — OZEMPIC (0.25 OR 0.5 MG/DOSE) 2 MG/3ML ~~LOC~~ SOPN: 0.5000 mg | PEN_INJECTOR | SUBCUTANEOUS | 0 refills | Status: DC

## 2022-11-24 ENCOUNTER — Ambulatory Visit
Admission: RE | Admit: 2022-11-24 | Discharge: 2022-11-24 | Disposition: A | Discharge status: Home / Self Care | Payer: BC Managed Care – PPO | Source: Ambulatory Visit

## 2022-11-24 DIAGNOSIS — Z1231 Encounter for screening mammogram for malignant neoplasm of breast: Secondary | ICD-10-CM

## 2022-11-30 ENCOUNTER — Other Ambulatory Visit: Payer: Self-pay | Admitting: Family Medicine

## 2022-11-30 ENCOUNTER — Telehealth: Payer: Self-pay | Admitting: Family Medicine

## 2022-11-30 NOTE — Telephone Encounter (Signed)
Form completed & faxed back.

## 2022-11-30 NOTE — Telephone Encounter (Signed)
Forms received handed over to provider.

## 2022-11-30 NOTE — Telephone Encounter (Signed)
New message   Medical clearance resent over this morning. Original sent over on  11/25/22 .   Surgical extraction is pending until medical clearance is complete.

## 2022-12-08 ENCOUNTER — Telehealth: Payer: Self-pay | Admitting: Family Medicine

## 2022-12-08 ENCOUNTER — Other Ambulatory Visit: Payer: Self-pay | Admitting: Family Medicine

## 2022-12-08 NOTE — Telephone Encounter (Signed)
Pt ask the patient to schedule an appointment

## 2022-12-08 NOTE — Telephone Encounter (Signed)
Patient called said on her mychart in her last visit said provider sent in clonazePAM (KLONOPIN) 0.5 MG tablet [[id number]]  but the pharmacy never received it.  Pharmacy:[facility]

## 2022-12-09 NOTE — Telephone Encounter (Signed)
Called patient, patient will call back to schedule an appointment.

## 2022-12-19 ENCOUNTER — Other Ambulatory Visit: Payer: Self-pay | Admitting: Family Medicine

## 2022-12-19 DIAGNOSIS — E7849 Other hyperlipidemia: Secondary | ICD-10-CM

## 2022-12-19 MED ORDER — ROSUVASTATIN CALCIUM 5 MG PO TABS: 5.0000 mg | ORAL_TABLET | Freq: Every day | ORAL | 3 refills | Status: DC

## 2022-12-19 NOTE — Progress Notes (Unsigned)
The ASCVD Risk score (Arnett DK, et al., 2019) failed to calculate for the following reasons:    Cannot find a previous HDL lab    Cannot find a previous total cholesterol lab

## 2022-12-20 NOTE — Telephone Encounter (Signed)
Pt informed

## 2022-12-29 DIAGNOSIS — E038 Other specified hypothyroidism: Secondary | ICD-10-CM | POA: Diagnosis not present

## 2022-12-29 DIAGNOSIS — E7849 Other hyperlipidemia: Secondary | ICD-10-CM | POA: Diagnosis not present

## 2022-12-29 DIAGNOSIS — Z114 Encounter for screening for human immunodeficiency virus [HIV]: Secondary | ICD-10-CM | POA: Diagnosis not present

## 2022-12-29 DIAGNOSIS — E1100 Type 2 diabetes mellitus with hyperosmolarity without nonketotic hyperglycemic-hyperosmolar coma (NKHHC): Secondary | ICD-10-CM | POA: Diagnosis not present

## 2022-12-29 DIAGNOSIS — Z1159 Encounter for screening for other viral diseases: Secondary | ICD-10-CM | POA: Diagnosis not present

## 2022-12-31 LAB — LIPID PANEL
Chol/HDL Ratio: 5.5 ratio — ABNORMAL HIGH (ref 0.0–4.4)
Cholesterol, Total: 202 mg/dL — ABNORMAL HIGH (ref 100–199)
HDL: 37 mg/dL — ABNORMAL LOW (ref >39)
LDL Chol Calc (NIH): 123 mg/dL — ABNORMAL HIGH (ref 0–99)
Triglycerides: 239 mg/dL — ABNORMAL HIGH (ref 0–149)
VLDL Cholesterol Cal: 42 mg/dL — ABNORMAL HIGH (ref 5–40)

## 2022-12-31 LAB — CMP14+EGFR
ALT: 16 IU/L (ref 0–32)
AST: 10 IU/L (ref 0–40)
Albumin/Globulin Ratio: 1.3 (ref 1.2–2.2)
Albumin: 4 g/dL (ref 3.9–4.9)
Alkaline Phosphatase: 106 IU/L (ref 44–121)
BUN/Creatinine Ratio: 11 (ref 9–23)
BUN: 7 mg/dL (ref 6–24)
Bilirubin Total: <0.2 mg/dL (ref 0.0–1.2)
CO2: 20 mmol/L (ref 20–29)
Calcium: 9.6 mg/dL (ref 8.7–10.2)
Chloride: 97 mmol/L (ref 96–106)
Creatinine, Ser: 0.65 mg/dL (ref 0.57–1.00)
Globulin, Total: 3.1 g/dL (ref 1.5–4.5)
Glucose: 365 mg/dL — ABNORMAL HIGH (ref 70–99)
Potassium: 4.4 mmol/L (ref 3.5–5.2)
Sodium: 134 mmol/L (ref 134–144)
Total Protein: 7.1 g/dL (ref 6.0–8.5)
eGFR: 113 mL/min/{1.73_m2} (ref >59)

## 2022-12-31 LAB — CBC WITH DIFFERENTIAL/PLATELET
Basophils Absolute: 0 10*3/uL (ref 0.0–0.2)
Basos: 0 %
EOS (ABSOLUTE): 0.2 10*3/uL (ref 0.0–0.4)
Eos: 3 %
Hematocrit: 43.6 % (ref 34.0–46.6)
Hemoglobin: 13 g/dL (ref 11.1–15.9)
Immature Grans (Abs): 0 10*3/uL (ref 0.0–0.1)
Immature Granulocytes: 0 %
Lymphocytes Absolute: 2 10*3/uL (ref 0.7–3.1)
Lymphs: 27 %
MCH: 22.6 pg — ABNORMAL LOW (ref 26.6–33.0)
MCHC: 29.8 g/dL — ABNORMAL LOW (ref 31.5–35.7)
MCV: 76 fL — ABNORMAL LOW (ref 79–97)
Monocytes Absolute: 0.5 10*3/uL (ref 0.1–0.9)
Monocytes: 7 %
Neutrophils Absolute: 4.7 10*3/uL (ref 1.4–7.0)
Neutrophils: 63 %
Platelets: 289 10*3/uL (ref 150–450)
RBC: 5.74 x10E6/uL — ABNORMAL HIGH (ref 3.77–5.28)
RDW: 13.2 % (ref 11.7–15.4)
WBC: 7.5 10*3/uL (ref 3.4–10.8)

## 2022-12-31 LAB — HIV ANTIBODY (ROUTINE TESTING W REFLEX): HIV Screen 4th Generation wRfx: NONREACTIVE

## 2022-12-31 LAB — TSH+FREE T4
Free T4: 1.14 ng/dL (ref 0.82–1.77)
TSH: 1.7 u[IU]/mL (ref 0.450–4.500)

## 2022-12-31 LAB — MICROALBUMIN / CREATININE URINE RATIO
Creatinine, Urine: 52.2 mg/dL
Microalb/Creat Ratio: 6 mg/g creat (ref 0–29)
Microalbumin, Urine: 3.1 ug/mL

## 2022-12-31 LAB — HEPATITIS C ANTIBODY: Hep C Virus Ab: NONREACTIVE

## 2023-01-02 ENCOUNTER — Other Ambulatory Visit: Payer: Self-pay | Admitting: Family Medicine

## 2023-01-02 DIAGNOSIS — E7849 Other hyperlipidemia: Secondary | ICD-10-CM

## 2023-01-02 DIAGNOSIS — E1100 Type 2 diabetes mellitus with hyperosmolarity without nonketotic hyperglycemic-hyperosmolar coma (NKHHC): Secondary | ICD-10-CM

## 2023-01-02 MED ORDER — METFORMIN HCL 500 MG PO TABS: 500.0000 mg | ORAL_TABLET | Freq: Every day | ORAL | 0 refills | Status: DC

## 2023-01-02 MED ORDER — ROSUVASTATIN CALCIUM 10 MG PO TABS: 10.0000 mg | ORAL_TABLET | Freq: Every day | ORAL | 3 refills | Status: DC

## 2023-01-02 MED ORDER — SEMAGLUTIDE (1 MG/DOSE) 4 MG/3ML ~~LOC~~ SOPN: 1.0000 mg | PEN_INJECTOR | SUBCUTANEOUS | 0 refills | Status: DC

## 2023-01-07 ENCOUNTER — Telehealth: Payer: Self-pay | Admitting: Family Medicine

## 2023-01-07 ENCOUNTER — Other Ambulatory Visit: Payer: Self-pay | Admitting: Family Medicine

## 2023-01-07 DIAGNOSIS — E1100 Type 2 diabetes mellitus with hyperosmolarity without nonketotic hyperglycemic-hyperosmolar coma (NKHHC): Secondary | ICD-10-CM

## 2023-01-07 MED ORDER — GLIMEPIRIDE 2 MG PO TABS: 2.0000 mg | ORAL_TABLET | Freq: Every day | ORAL | 3 refills | Status: DC

## 2023-01-07 NOTE — Telephone Encounter (Signed)
Pt called in regard to metFORMIN (GLUCOPHAGE) 500 MG tablet    Pt cannot take medicine, upsets patients stomach. Wants a callback with alternative med that she can take.

## 2023-01-07 NOTE — Telephone Encounter (Signed)
Glimepiride 2 mg daily is sent to her pharmacy, kindly encourage the patient to start taking

## 2023-01-10 NOTE — Telephone Encounter (Signed)
Lmtrc

## 2023-01-14 ENCOUNTER — Encounter: Payer: Self-pay | Admitting: Family Medicine

## 2023-01-14 ENCOUNTER — Ambulatory Visit (INDEPENDENT_AMBULATORY_CARE_PROVIDER_SITE_OTHER): Payer: BC Managed Care – PPO | Admitting: Family Medicine

## 2023-01-14 VITALS — BP 125/83 | HR 80 | Wt 218.0 lb

## 2023-01-14 DIAGNOSIS — Z7984 Long term (current) use of oral hypoglycemic drugs: Secondary | ICD-10-CM

## 2023-01-14 DIAGNOSIS — E1100 Type 2 diabetes mellitus with hyperosmolarity without nonketotic hyperglycemic-hyperosmolar coma (NKHHC): Secondary | ICD-10-CM

## 2023-01-14 MED ORDER — FREESTYLE LIBRE 3 SENSOR MISC: 1.0000 | Freq: Every day | 3 refills | Status: DC

## 2023-01-14 MED ORDER — GLIMEPIRIDE 2 MG PO TABS: 2.0000 mg | ORAL_TABLET | Freq: Every day | ORAL | 3 refills | Status: DC

## 2023-01-14 MED ORDER — FREESTYLE LIBRE 3 SENSOR MISC: 4 refills | Status: DC

## 2023-01-14 NOTE — Patient Instructions (Signed)
I appreciate the opportunity to provide care to you today!    Follow up:  3 months  Labs: please stop by the lab today to get your blood drawn (HgA1c)   Please pick up your prescription at the pharmacy   Please continue to a heart-healthy diet and increase your physical activities. Try to exercise for at least five days a week.      It was a pleasure to see you and I look forward to continuing to work together on your health and well-being. Please do not hesitate to call the office if you need care or have questions about your care.   Have a wonderful day and week. With Gratitude, Gilmore Laroche MSN, FNP-BC

## 2023-01-14 NOTE — Progress Notes (Signed)
Established Patient Office Visit  Subjective:  Patient ID: Alexandra Ortiz, female    DOB: [birth year]  Age: 42 y.o. MRN: [id number]  CC:  Chief Complaint  Patient presents with   Diabetes    Pt needs oral surgery needs to discuss about A1c levels this is what's holding the surgery up.     HPI Alexandra Ortiz is a 42 y.o. female with past medical history of type 2 diabetes, anxiety and depression presents for f/u of  chronic medical conditions. For the details of today's visit, please refer to the assessment and plan.     Past Medical History:  Diagnosis Date   Diabetes mellitus without complication (HCC)     History reviewed. No pertinent surgical history.  Family History  Problem Relation Age of Onset   Diabetes Mother    Diabetes Father    Hypertension Father    Heart failure Father     Social History   Socioeconomic History   Marital status: Married    Spouse name: Not on file   Number of children: Not on file   Years of education: Not on file   Highest education level: Not on file  Occupational History   Not on file  Tobacco Use   Smoking status: Never    Passive exposure: Past   Smokeless tobacco: Never  Vaping Use   Vaping Use: Never used  Substance and Sexual Activity   Alcohol use: Not Currently   Drug use: Yes    Types: Marijuana   Sexual activity: Yes  Other Topics Concern   Not on file  Social History Narrative   ** Merged History Encounter **       Social Determinants of Health   Financial Resource Strain: Not on file  Food Insecurity: Not on file  Transportation Needs: Not on file  Physical Activity: Not on file  Stress: Not on file  Social Connections: Not on file  Intimate Partner Violence: Not on file    Outpatient Medications Prior to Visit  Medication Sig Dispense Refill   clonazePAM (KLONOPIN) 0.5 MG tablet Take 0.5 mg by mouth 2 (two) times daily as needed for anxiety.     cyclobenzaprine (FLEXERIL) 5 MG tablet Take 1  tablet (5 mg total) by mouth at bedtime. 20 tablet 0   lidocaine (LIDODERM) 5 % Place 1 patch onto the skin daily. Remove & Discard patch within 12 hours or as directed by MD 30 patch 0   rosuvastatin (CRESTOR) 10 MG tablet Take 1 tablet (10 mg total) by mouth daily. 90 tablet 3   Semaglutide, 1 MG/DOSE, 4 MG/3ML SOPN Inject 1 mg as directed once a week. 3 mL 0   SUMAtriptan (IMITREX) 25 MG tablet Take 1 tablet at the start of a migraine. If the pain persists after 2 hours take 1 more tablet. Do not exceed 2 doses in 24 hours. 10 tablet 0   glimepiride (AMARYL) 2 MG tablet Take 1 tablet (2 mg total) by mouth daily before breakfast. 30 tablet 3   No facility-administered medications prior to visit.    Allergies  Allergen Reactions   Tape    Wound Dressing Adhesive Hives    ROS Review of Systems  Constitutional:  Negative for chills and fever.  Eyes:  Negative for visual disturbance.  Respiratory:  Negative for chest tightness and shortness of breath.   Endocrine: Positive for polyuria.  Neurological:  Negative for dizziness and headaches.  All other    Objective:  Physical Exam HENT:     Head: Normocephalic.     Mouth/Throat:     Mouth: Mucous membranes are moist.  Cardiovascular:     Rate and Rhythm: Normal rate.     Heart sounds: Normal heart sounds.  Pulmonary:     Effort: Pulmonary effort is normal.     Breath sounds: Normal breath sounds.  Neurological:     Mental Status: She is alert.     BP 125/83   Pulse 80   Wt 218 lb 0.6 oz (98.9 kg)   SpO2 96%   BMI 39.88 kg/m  Wt Readings from Last 3 Encounters:  01/14/23 218 lb 0.6 oz (98.9 kg)  11/08/22 228 lb (103.4 kg)    Lab Results  Component Value Date   TSH 1.700 12/29/2022   Lab Results  Component Value Date   WBC 7.5 12/29/2022   HGB 13.0 12/29/2022   HCT 43.6 12/29/2022   MCV 76 (L) 12/29/2022   PLT 289 12/29/2022   Lab Results  Component Value Date   NA 134 12/29/2022   K 4.4 12/29/2022    CO2 20 12/29/2022   GLUCOSE 365 (H) 12/29/2022   BUN 7 12/29/2022   CREATININE 0.65 12/29/2022   BILITOT <0.2 12/29/2022   ALKPHOS 106 12/29/2022   AST 10 12/29/2022   ALT 16 12/29/2022   PROT 7.1 12/29/2022   ALBUMIN 4.0 12/29/2022   CALCIUM 9.6 12/29/2022   EGFR 113 12/29/2022   Lab Results  Component Value Date   CHOL 202 (H) 12/29/2022   Lab Results  Component Value Date   HDL 37 (L) 12/29/2022   Lab Results  Component Value Date   LDLCALC 123 (H) 12/29/2022   Lab Results  Component Value Date   TRIG 239 (H) 12/29/2022   Lab Results  Component Value Date   CHOLHDL 5.5 (H) 12/29/2022   Lab Results  Component Value Date   HGBA1C 12.6 (A) 11/08/2022      Assessment & Plan:  Type 2 diabetes mellitus with hyperosmolarity without coma, without long-term current use of insulin (HCC) Assessment & Plan: Highest BS:300 Lowest BS: 120 Denies polyphagia and polydipsia but reports polyuria wants her hemoglobin A1c assess for her oral surgery Will optimized for HgA1c<8  Lab Results  Component Value Date   HGBA1C 12.6 (A) 11/08/2022       Orders: -     FreeStyle Libre 3 Sensor; Place 1 sensor on the skin every 14 days. Use to check glucose continuously  Dispense: 2 each; Refill: 4  Type 2 diabetes mellitus with hyperosmolarity without coma, unspecified whether long term insulin use (HCC) -     Hemoglobin A1c -     Glimepiride; Take 1 tablet (2 mg total) by mouth daily before breakfast.  Dispense: 30 tablet; Refill: 3    Follow-up: Return in about 3 months (around 04/16/2023).   Gilmore Laroche, FNP

## 2023-01-14 NOTE — Assessment & Plan Note (Addendum)
Highest BS:300 Lowest BS: 120 Denies polyphagia and polydipsia but reports polyuria wants her hemoglobin A1c assess for her oral surgery Will optimized for HgA1c<8  Lab Results  Component Value Date   HGBA1C 12.6 (A) 11/08/2022

## 2023-01-15 LAB — HEMOGLOBIN A1C
Est. average glucose Bld gHb Est-mCnc: 346 mg/dL
Hgb A1c MFr Bld: 13.7 % — ABNORMAL HIGH (ref 4.8–5.6)

## 2023-01-18 ENCOUNTER — Other Ambulatory Visit: Payer: Self-pay | Admitting: Family Medicine

## 2023-01-18 DIAGNOSIS — E1100 Type 2 diabetes mellitus with hyperosmolarity without nonketotic hyperglycemic-hyperosmolar coma (NKHHC): Secondary | ICD-10-CM

## 2023-01-18 MED ORDER — GLIMEPIRIDE 4 MG PO TABS: 4.0000 mg | ORAL_TABLET | Freq: Every day | ORAL | 3 refills | Status: DC

## 2023-01-20 ENCOUNTER — Ambulatory Visit: Payer: BC Managed Care – PPO | Admitting: Family Medicine

## 2023-01-25 ENCOUNTER — Telehealth: Payer: Self-pay | Admitting: Family Medicine

## 2023-01-25 NOTE — Telephone Encounter (Signed)
Pt states rx was approved when ran through the pharmacy.

## 2023-02-09 ENCOUNTER — Ambulatory Visit: Payer: BC Managed Care – PPO | Admitting: Family Medicine

## 2023-02-21 ENCOUNTER — Encounter: Payer: Self-pay | Admitting: Family Medicine

## 2023-02-21 ENCOUNTER — Other Ambulatory Visit: Payer: Self-pay | Admitting: Family Medicine

## 2023-03-14 NOTE — Telephone Encounter (Signed)
Hello Alexandra Ortiz Please inform the patient at your earliest convenience regarding when she can pick up the letter.

## 2023-03-15 ENCOUNTER — Encounter: Payer: Self-pay | Admitting: Family Medicine

## 2023-04-10 ENCOUNTER — Other Ambulatory Visit: Payer: Self-pay | Admitting: Family Medicine

## 2023-04-10 DIAGNOSIS — E1100 Type 2 diabetes mellitus with hyperosmolarity without nonketotic hyperglycemic-hyperosmolar coma (NKHHC): Secondary | ICD-10-CM

## 2023-04-15 ENCOUNTER — Other Ambulatory Visit: Payer: Self-pay | Admitting: Family Medicine

## 2023-04-15 DIAGNOSIS — E1100 Type 2 diabetes mellitus with hyperosmolarity without nonketotic hyperglycemic-hyperosmolar coma (NKHHC): Secondary | ICD-10-CM

## 2023-04-15 MED ORDER — FREESTYLE LIBRE 3 SENSOR MISC: 4 refills | Status: DC

## 2023-04-22 ENCOUNTER — Encounter: Payer: Self-pay | Admitting: Family Medicine

## 2023-04-22 ENCOUNTER — Ambulatory Visit (INDEPENDENT_AMBULATORY_CARE_PROVIDER_SITE_OTHER): Payer: BC Managed Care – PPO | Admitting: Family Medicine

## 2023-04-22 VITALS — BP 109/69 | HR 90 | Ht 62.0 in | Wt 221.0 lb

## 2023-04-22 DIAGNOSIS — E559 Vitamin D deficiency, unspecified: Secondary | ICD-10-CM

## 2023-04-22 DIAGNOSIS — E7849 Other hyperlipidemia: Secondary | ICD-10-CM

## 2023-04-22 DIAGNOSIS — R110 Nausea: Secondary | ICD-10-CM

## 2023-04-22 DIAGNOSIS — Z7984 Long term (current) use of oral hypoglycemic drugs: Secondary | ICD-10-CM

## 2023-04-22 DIAGNOSIS — E1100 Type 2 diabetes mellitus with hyperosmolarity without nonketotic hyperglycemic-hyperosmolar coma (NKHHC): Secondary | ICD-10-CM | POA: Diagnosis not present

## 2023-04-22 DIAGNOSIS — E038 Other specified hypothyroidism: Secondary | ICD-10-CM

## 2023-04-22 DIAGNOSIS — R7301 Impaired fasting glucose: Secondary | ICD-10-CM

## 2023-04-22 MED ORDER — ONDANSETRON 4 MG PO TBDP: 4.0000 mg | ORAL_TABLET | Freq: Three times a day (TID) | ORAL | 0 refills | Status: DC | PRN

## 2023-04-22 NOTE — Progress Notes (Signed)
Established Patient Office Visit  Subjective:  Patient ID: Alexandra Ortiz, female    DOB: 09-03-1980  Age: 42 y.o. MRN: [id number]  CC:  Chief Complaint  Patient presents with   Care Management    3 month f/u, mild discomfort on both kidneys, has been drinking a lot of water.    Menstrual Problem    Would like nausea medication for sx of feeling nauseous while on cycle.    HPI Alexandra Ortiz is a 42 y.o. female with past medical history of type 2 diabetes presents for f/u of  chronic medical conditions. For the details of today's visit, please refer to the assessment and plan.    The patient denies experiencing hematuria, fever, urinary urgency, frequency, or dysuria. She reports that while she has no pain today, she does experience intermittent mild pain. The patient was advised to follow up if she develops symptoms such as fever, chills, hematuria, or other urinary symptoms. She was also encouraged to increase her fluid intake to at least 64 ounces daily.   Past Medical History:  Diagnosis Date   Diabetes mellitus without complication (HCC)     History reviewed. No pertinent surgical history.  Family History  Problem Relation Age of Onset   Diabetes Mother    Diabetes Father    Hypertension Father    Heart failure Father     Social History   Socioeconomic History   Marital status: Married    Spouse name: Not on file   Number of children: Not on file   Years of education: Not on file   Highest education level: Not on file  Occupational History   Not on file  Tobacco Use   Smoking status: Never    Passive exposure: Past   Smokeless tobacco: Never  Vaping Use   Vaping status: Never Used  Substance and Sexual Activity   Alcohol use: Not Currently   Drug use: Yes    Types: Marijuana   Sexual activity: Yes  Other Topics Concern   Not on file  Social History Narrative   ** Merged History Encounter **       Social Determinants of Health   Financial  Resource Strain: Not on file  Food Insecurity: Not on file  Transportation Needs: Not on file  Physical Activity: Not on file  Stress: Not on file  Social Connections: Not on file  Intimate Partner Violence: Not on file    Outpatient Medications Prior to Visit  Medication Sig Dispense Refill   clonazePAM (KLONOPIN) 0.5 MG tablet Take 0.5 mg by mouth 2 (two) times daily as needed for anxiety.     lidocaine (LIDODERM) 5 % Place 1 patch onto the skin daily. Remove & Discard patch within 12 hours or as directed by MD 30 patch 0   Continuous Glucose Sensor (FREESTYLE LIBRE 3 SENSOR) MISC Place 1 sensor on the skin every 14 days as directed. Use to check glucose continuously 2 each 4   cyclobenzaprine (FLEXERIL) 5 MG tablet Take 1 tablet (5 mg total) by mouth at bedtime. 20 tablet 0   glimepiride (AMARYL) 4 MG tablet Take 1 tablet (4 mg total) by mouth daily before breakfast. 30 tablet 3   rosuvastatin (CRESTOR) 10 MG tablet Take 1 tablet (10 mg total) by mouth daily. 90 tablet 3   Semaglutide, 1 MG/DOSE, 4 MG/3ML SOPN Inject 1 mg as directed once a week. (Patient not taking: Reported on 04/22/2023) 3 mL 0   SUMAtriptan (IMITREX) 25 MG  tablet Take 1 tablet at the start of a migraine. If the pain persists after 2 hours take 1 more tablet. Do not exceed 2 doses in 24 hours. (Patient not taking: Reported on 04/22/2023) 10 tablet 0   No facility-administered medications prior to visit.    Allergies  Allergen Reactions   Tape    Wound Dressing Adhesive Hives    ROS Review of Systems  Constitutional:  Negative for chills and fever.  Eyes:  Negative for visual disturbance.  Respiratory:  Negative for chest tightness and shortness of breath.   Gastrointestinal:  Positive for nausea.  Neurological:  Negative for dizziness and headaches.      Objective:    Physical Exam HENT:     Head: Normocephalic.     Mouth/Throat:     Mouth: Mucous membranes are moist.  Cardiovascular:     Rate and  Rhythm: Normal rate.     Heart sounds: Normal heart sounds.  Pulmonary:     Effort: Pulmonary effort is normal.     Breath sounds: Normal breath sounds.  Abdominal:     General: Abdomen is flat. Bowel sounds are normal.     Palpations: Abdomen is soft.     Tenderness: There is no abdominal tenderness. There is no right CVA tenderness or left CVA tenderness.  Neurological:     Mental Status: She is alert.     BP 109/69   Pulse 90   Ht 5\' 2"  (1.575 m)   Wt 221 lb (100.2 kg)   SpO2 95%   BMI 40.42 kg/m  Wt Readings from Last 3 Encounters:  04/22/23 221 lb (100.2 kg)  01/14/23 218 lb 0.6 oz (98.9 kg)  11/08/22 228 lb (103.4 kg)    Lab Results  Component Value Date   TSH 1.700 12/29/2022   Lab Results  Component Value Date   WBC 7.5 12/29/2022   HGB 13.0 12/29/2022   HCT 43.6 12/29/2022   MCV 76 (L) 12/29/2022   PLT 289 12/29/2022   Lab Results  Component Value Date   NA 134 12/29/2022   K 4.4 12/29/2022   CO2 20 12/29/2022   GLUCOSE 365 (H) 12/29/2022   BUN 7 12/29/2022   CREATININE 0.65 12/29/2022   BILITOT <0.2 12/29/2022   ALKPHOS 106 12/29/2022   AST 10 12/29/2022   ALT 16 12/29/2022   PROT 7.1 12/29/2022   ALBUMIN 4.0 12/29/2022   CALCIUM 9.6 12/29/2022   EGFR 113 12/29/2022   Lab Results  Component Value Date   CHOL 202 (H) 12/29/2022   Lab Results  Component Value Date   HDL 37 (L) 12/29/2022   Lab Results  Component Value Date   LDLCALC 123 (H) 12/29/2022   Lab Results  Component Value Date   TRIG 239 (H) 12/29/2022   Lab Results  Component Value Date   CHOLHDL 5.5 (H) 12/29/2022   Lab Results  Component Value Date   HGBA1C 13.7 (H) 01/14/2023      Assessment & Plan:  Type 2 diabetes mellitus with hyperosmolarity without coma, without long-term current use of insulin (HCC) Assessment & Plan: The patient reports that she has been unable to take Ozempic 1 mg weekly due to her insurance no longer covering the medication and her  inability to afford the out-of-pocket costs. She has, however, implemented lifestyle changes by reducing portion sizes and adhering to a heart-healthy diet. We will assess her HbA1c today and make necessary adjustments to her treatment regimen. The patient denies  experiencing polyuria, polyphagia, or polydipsia. We discussed the potential microvascular and macrovascular complications of type 2 diabetes and emphasized the importance of treatment adherence.  Lab Results  Component Value Date   HGBA1C 13.7 (H) 01/14/2023     Orders: -     [facility] Diabetes Foot Exam -     Referral to Nutrition and Diabetes Services  Nausea Assessment & Plan: The patient reports experiencing severe nausea during the first three days of her menstrual cycle. It is recommended that she take Zofran 4 mg every 5 hours as needed to manage these symptoms.  Orders: -     Ondansetron; Take 1 tablet (4 mg total) by mouth every 8 (eight) hours as needed for nausea or vomiting.  Dispense: 20 tablet; Refill: 0  IFG (impaired fasting glucose) -     Hemoglobin A1c  Vitamin D deficiency -     VITAMIN D 25 Hydroxy (Vit-D Deficiency, Fractures)  Other specified hypothyroidism -     TSH + free T4  Other hyperlipidemia -     Lipid panel -     CMP14+EGFR -     CBC with Differential/Platelet  Note: This chart has been completed using Engineer, civil (consulting) software, and while attempts have been made to ensure accuracy, certain words and phrases may not be transcribed as intended.    Follow-up: Return in about 4 months (around 08/22/2023).   Gilmore Laroche, FNP

## 2023-04-22 NOTE — Patient Instructions (Addendum)
I appreciate the opportunity to provide care to you today!    Follow up:  4 months  Labs: please stop by the lab during the week to get your blood drawn (CBC, CMP, TSH, Lipid profile, HgA1c, Vit D)  Weight Loss Tips -Incorporate Nutrient-Dense Foods: Increase your intake of fruits, vegetables, and whole grains to enhance your diet with essential vitamins, minerals, and fiber. -Choose Lean Proteins: Opt for lean protein sources such as chicken, fish, beans, and legumes to support muscle maintenance and overall health. -Select Low-Fat Dairy Products: Include low-fat dairy options to obtain necessary calcium and protein while minimizing excess fat intake. -Reduce Saturated and Trans Fats: Limit your consumption of saturated fats, trans fatty acids, and cholesterol to promote heart health and support weight management. -Engage in Regular Physical Activity: Aim to be active for at least 30 minutes on most days of the week. Activities such as brisk walking can help you maintain a healthy weight and improve overall well-being.   Referrals today- Nutrition and Diabetes Services  Attached with your AVS, you will find valuable resources for self-education. I highly recommend dedicating some time to thoroughly examine them.   Please continue to a heart-healthy diet and increase your physical activities. Try to exercise for at least five days a week.    It was a pleasure to see you and I look forward to continuing to work together on your health and well-being. Please do not hesitate to call the office if you need care or have questions about your care.  In case of emergency, please visit the Emergency Department for urgent care, or contact our clinic at [phone] to schedule an appointment. We're here to help you!   Have a wonderful day and week. With Gratitude, Gilmore Laroche MSN, FNP-BC

## 2023-04-22 NOTE — Assessment & Plan Note (Addendum)
The patient reports experiencing severe nausea during the first three days of her menstrual cycle. It is recommended that she take Zofran 4 mg every 5 hours as needed to manage these symptoms.

## 2023-04-22 NOTE — Assessment & Plan Note (Signed)
The patient reports that she has been unable to take Ozempic 1 mg weekly due to her insurance no longer covering the medication and her inability to afford the out-of-pocket costs. She has, however, implemented lifestyle changes by reducing portion sizes and adhering to a heart-healthy diet. We will assess her HbA1c today and make necessary adjustments to her treatment regimen. The patient denies experiencing polyuria, polyphagia, or polydipsia. We discussed the potential microvascular and macrovascular complications of type 2 diabetes and emphasized the importance of treatment adherence.  Lab Results  Component Value Date   HGBA1C 13.7 (H) 01/14/2023

## 2023-06-15 ENCOUNTER — Ambulatory Visit: Payer: BC Managed Care – PPO | Admitting: Nutrition

## 2023-07-29 ENCOUNTER — Telehealth: Payer: Self-pay

## 2023-07-29 ENCOUNTER — Other Ambulatory Visit: Payer: Self-pay | Admitting: Family Medicine

## 2023-07-29 DIAGNOSIS — R52 Pain, unspecified: Secondary | ICD-10-CM

## 2023-07-29 MED ORDER — NAPROXEN 500 MG PO TABS
500.0000 mg | ORAL_TABLET | Freq: Two times a day (BID) | ORAL | 0 refills | Status: DC
Start: 2023-07-29 — End: 2024-01-06

## 2023-07-29 NOTE — Telephone Encounter (Signed)
Kindly inform the patient to return for labs at her earliest convenience to assess her HbA1c.

## 2023-07-29 NOTE — Telephone Encounter (Signed)
Pt informed

## 2023-07-29 NOTE — Telephone Encounter (Signed)
Copied from CRM [id number]. Topic: Clinical - Medical Advice >> Jul 29, 2023 10:08 AM Gaetano Hawthorne wrote: Reason for CRM: Patient would like to speak with someone regarding her A1c levels - she was trying to have one of her wisdom teeth removed but they would not do it because of her A1c levels - right now, the tooth is hanging by a thread and is causing a lot of discomfort/difficulty with eating. Please call patient to assist.

## 2023-07-29 NOTE — Telephone Encounter (Signed)
A prescription for naproxen has been sent to her pharmacy.

## 2023-07-29 NOTE — Telephone Encounter (Signed)
States her tooth is hanging by thread in the back of her mouth and causing too much pain , states she wants to be cleared by PCP to have the extraction, let her know orders for A1c are in from August of this year, states she can't do labs right now due to working two jobs.

## 2023-07-29 NOTE — Telephone Encounter (Signed)
States she cannot eat due to the pain, would like something called in for the pain.

## 2023-08-19 ENCOUNTER — Ambulatory Visit: Payer: BC Managed Care – PPO | Admitting: Family Medicine

## 2023-09-09 ENCOUNTER — Emergency Department (HOSPITAL_COMMUNITY)
Admission: EM | Admit: 2023-09-09 | Discharge: 2023-09-09 | Disposition: A | Discharge status: Home / Self Care | Payer: BC Managed Care – PPO | Attending: Emergency Medicine | Admitting: Emergency Medicine

## 2023-09-09 ENCOUNTER — Encounter (HOSPITAL_COMMUNITY): Payer: Self-pay

## 2023-09-09 ENCOUNTER — Other Ambulatory Visit: Payer: Self-pay

## 2023-09-09 DIAGNOSIS — R112 Nausea with vomiting, unspecified: Secondary | ICD-10-CM | POA: Insufficient documentation

## 2023-09-09 DIAGNOSIS — E860 Dehydration: Secondary | ICD-10-CM | POA: Insufficient documentation

## 2023-09-09 LAB — CBC
HCT: 40.2 % (ref 36.0–46.0)
Hemoglobin: 12.8 g/dL (ref 12.0–15.0)
MCH: 23.5 pg — ABNORMAL LOW (ref 26.0–34.0)
MCHC: 31.8 g/dL (ref 30.0–36.0)
MCV: 73.9 fL — ABNORMAL LOW (ref 80.0–100.0)
Platelets: 307 10*3/uL (ref 150–400)
RBC: 5.44 MIL/uL — ABNORMAL HIGH (ref 3.87–5.11)
RDW: 13.5 % (ref 11.5–15.5)
WBC: 8.6 10*3/uL (ref 4.0–10.5)
nRBC: 0 % (ref 0.0–0.2)

## 2023-09-09 LAB — URINALYSIS, ROUTINE W REFLEX MICROSCOPIC
Bacteria, UA: NONE SEEN
Bilirubin Urine: NEGATIVE
Glucose, UA: >=500 mg/dL — AB
Ketones, ur: NEGATIVE mg/dL
Leukocytes,Ua: NEGATIVE
Nitrite: NEGATIVE
Protein, ur: NEGATIVE mg/dL
RBC / HPF: >50 RBC/hpf (ref 0–5)
Specific Gravity, Urine: 1.039 — ABNORMAL HIGH (ref 1.005–1.030)
WBC, UA: >50 WBC/hpf (ref 0–5)
pH: 6 (ref 5.0–8.0)

## 2023-09-09 LAB — COMPREHENSIVE METABOLIC PANEL
ALT: 16 U/L (ref 0–44)
AST: 16 U/L (ref 15–41)
Albumin: 3.9 g/dL (ref 3.5–5.0)
Alkaline Phosphatase: 74 U/L (ref 38–126)
Anion gap: 7 (ref 5–15)
BUN: 8 mg/dL (ref 6–20)
CO2: 25 mmol/L (ref 22–32)
Calcium: 8.8 mg/dL — ABNORMAL LOW (ref 8.9–10.3)
Chloride: 100 mmol/L (ref 98–111)
Creatinine, Ser: 0.49 mg/dL (ref 0.44–1.00)
GFR, Estimated: >60 mL/min (ref >60)
Glucose, Bld: 308 mg/dL — ABNORMAL HIGH (ref 70–99)
Potassium: 3.8 mmol/L (ref 3.5–5.1)
Sodium: 132 mmol/L — ABNORMAL LOW (ref 135–145)
Total Bilirubin: 0.7 mg/dL (ref 0.0–1.2)
Total Protein: 7.6 g/dL (ref 6.5–8.1)

## 2023-09-09 LAB — POC URINE PREG, ED: Preg Test, Ur: NEGATIVE

## 2023-09-09 LAB — LIPASE, BLOOD: Lipase: 36 U/L (ref 11–51)

## 2023-09-09 LAB — GROUP A STREP BY PCR: Group A Strep by PCR: NOT DETECTED

## 2023-09-09 MED ORDER — ONDANSETRON HCL 4 MG PO TABS: 4.0000 mg | ORAL_TABLET | Freq: Three times a day (TID) | ORAL | 0 refills | Status: DC | PRN

## 2023-09-09 MED ORDER — LACTATED RINGERS IV BOLUS
1000.0000 mL | Freq: Once | INTRAVENOUS | Status: AC
  Administered 2023-09-09: 1000 mL via INTRAVENOUS

## 2023-09-09 MED ORDER — ONDANSETRON 4 MG PO TBDP: 4.0000 mg | ORAL_TABLET | Freq: Three times a day (TID) | ORAL | 0 refills | Status: DC | PRN

## 2023-09-09 MED ORDER — ONDANSETRON HCL 4 MG/2ML IJ SOLN
4.0000 mg | Freq: Once | INTRAMUSCULAR | Status: AC
  Administered 2023-09-09: 4 mg via INTRAVENOUS
  Filled 2023-09-09: qty 2

## 2023-09-09 NOTE — ED Notes (Signed)
Patient resting in bed, No episodes of  nausea or vomiting at present. Tolerated IV fluids.

## 2023-09-09 NOTE — Discharge Instructions (Signed)
You were seen today for concerns of nausea and vomiting. Your labs look fairly reassuring although there was some mild dehydration. Zofran seems to have helped your nausea quite so I have prescribed this for you for home use. Please take this as prescribed. For any worsening symptoms, return to the ER. Otherwise follow up with your primary care provider.

## 2023-09-09 NOTE — ED Triage Notes (Signed)
Pt stated that she is on her period and typically has emesis for the first day but this time it has been going on for 3 days. Pt stated that she has not been able to keep any liquids or food down for the last three days.

## 2023-09-09 NOTE — ED Provider Notes (Signed)
[place] EMERGENCY DEPARTMENT AT [facility] Provider Note   CSN: [id number] Arrival date & time: 09/09/23  1922    History Chief Complaint  Patient presents with   Emesis    Alexandra Ortiz is a 43 y.o. female.  Patient with past history significant for type 2 diabetes presents to the emergency department concerns of vomiting.  She reports that she has had vomiting off and on for last 3 days and not tolerating oral intake.  She is on her period and states she typically has some level of vomiting around menstrual cycles but not typically for this prolonged period denies recent fever, chills or bodyaches.  No abdominal pain or diarrhea.  No recent sick contacts as far as patient is aware.  Does endorse a bit of a sore throat but believes this may be due to the vomiting.   Emesis      Home Medications Prior to Admission medications   Medication Sig Start Date End Date Taking? Authorizing Provider  ondansetron (ZOFRAN) 4 MG tablet Take 1 tablet (4 mg total) by mouth every 8 (eight) hours as needed for nausea or vomiting. 09/09/23  Yes Maryanna Shape A, PA-C  ondansetron (ZOFRAN-ODT) 4 MG disintegrating tablet Take 1 tablet (4 mg total) by mouth every 8 (eight) hours as needed for nausea or vomiting. 09/09/23  Yes Smitty Knudsen, PA-C  clonazePAM (KLONOPIN) 0.5 MG tablet Take 0.5 mg by mouth 2 (two) times daily as needed for anxiety.    [provider]  lidocaine (LIDODERM) 5 % Place 1 patch onto the skin daily. Remove & Discard patch within 12 hours or as directed by MD 01/04/22   Ernie Avena, MD  naproxen (NAPROSYN) 500 MG tablet Take 1 tablet (500 mg total) by mouth 2 (two) times daily with a meal. 07/29/23   Gilmore Laroche, FNP  Semaglutide, 1 MG/DOSE, 4 MG/3ML SOPN Inject 1 mg as directed once a week. Patient not taking: Reported on 04/22/2023 01/02/23   Gilmore Laroche, FNP      Allergies    Tape and Wound dressing adhesive    Review of Systems   Review of  Systems  Gastrointestinal:  Positive for nausea and vomiting.  All other systems reviewed and are negative.   Physical Exam Updated Vital Signs BP (!) 104/57   Pulse 82   Temp 98.3 F (36.8 C) (Oral)   Resp 16   Ht 5' 2.5" (1.588 m)   Wt 97.5 kg   SpO2 99%   BMI 38.70 kg/m  Physical Exam Vitals and nursing note reviewed.  Constitutional:      General: She is not in acute distress.    Appearance: Normal appearance. She is well-developed. She is not ill-appearing.  HENT:     Head: Normocephalic and atraumatic.  Eyes:     Conjunctiva/sclera: Conjunctivae normal.  Cardiovascular:     Rate and Rhythm: Normal rate and regular rhythm.     Heart sounds: No murmur heard. Pulmonary:     Effort: Pulmonary effort is normal. No respiratory distress.     Breath sounds: Normal breath sounds.  Abdominal:     General: Abdomen is flat. Bowel sounds are normal. There is no distension.     Palpations: Abdomen is soft.     Tenderness: There is no abdominal tenderness. There is no guarding.  Musculoskeletal:        General: No swelling.     Cervical back: Neck supple.  Skin:    General: Skin  is warm and dry.     Capillary Refill: Capillary refill takes less than 2 seconds.  Neurological:     Mental Status: She is alert.  Psychiatric:        Mood and Affect: Mood normal.     ED Results / Procedures / Treatments   Labs (all labs ordered are listed, but only abnormal results are displayed) Labs Reviewed  COMPREHENSIVE METABOLIC PANEL - Abnormal; Notable for the following components:      Result Value   Sodium 132 (*)    Glucose, Bld 308 (*)    Calcium 8.8 (*)    All other components within normal limits  CBC - Abnormal; Notable for the following components:   RBC 5.44 (*)    MCV 73.9 (*)    MCH 23.5 (*)    All other components within normal limits  URINALYSIS, ROUTINE W REFLEX MICROSCOPIC - Abnormal; Notable for the following components:   Specific Gravity, Urine 1.039 (*)     Glucose, UA >=500 (*)    Hgb urine dipstick LARGE (*)    All other components within normal limits  POC URINE PREG, ED - Normal  GROUP A STREP BY PCR  LIPASE, BLOOD    EKG None  Radiology No results found.  Procedures Procedures    Medications Ordered in ED Medications  lactated ringers bolus 1,000 mL (0 mLs Intravenous Stopped 09/09/23 2224)  ondansetron (ZOFRAN) injection 4 mg (4 mg Intravenous Given 09/09/23 2127)    ED Course/ Medical Decision Making/ A&P                                 Medical Decision Making Amount and/or Complexity of Data Reviewed Labs: ordered.  Risk Prescription drug management.   This patient presents to the ED for concern of vomiting.  Differential diagnosis includes gastroenteritis, dehydration, viral URI, strep pharyngitis   Lab Tests:  I Ordered, and personally interpreted labs.  The pertinent results include: CBC with increased white count possibly due to hemoconcentration, CMP with mild dehydration with hyponatremia 132 but no evidence of AKI, UA with increased Pacific gravity likely due to dehydration and some hemoglobin possibly from patient's menstrual cycle.  Urine pregnancy negative, group A strep negative   Medicines ordered and prescription drug management:  I ordered medication including Zofran, fluids for nausea, dehydration Reevaluation of the patient after these medicines showed that the patient improved I have reviewed the patients home medicines and have made adjustments as needed   Problem List / ED Course:  Patient with past history significant for type 2 diabetes presents to the emergency department with concerns of vomiting.  States that she has had about 3 days of poor oral intake due to significant nausea and vomiting.  Denies any hematemesis.  States that she typically has some vomiting around menstrual cycle and is currently in menstrual cycle but typically does not vomiting for this long.  Denies any chance of  pregnancy although she is sexually active.  She does not have any acute focal abdominal tenderness and no diarrhea.  No recent fever chills or bodyaches.  No sick contacts. Attempting to symptomatically treat with Zofran and fluids.  Labs obtained primarily to assess for possible fluid status and potential AKI. Labs are thankfully reassuring but there is evidence of mild dehydration with sodium decreased to 132 and urinalysis showing increased Pacific gravity.  No evidence of AKI however.  Lipase unremarkable,  group A strep negative and urine pregnancy negative. Given lack of focal abdominal pain, do not believe that CT imaging is needed to rule out more concerning causes such as appendicitis, bowel obstruction.  Patient does not have any focal abdominal tenderness and is still passing gas and has had bowel movements.  I suspect this is more likely to be due to patient's presentation with vomiting in the setting of menses although could potentially also be gastroenteritis. Patient endorsing improvement in symptoms after fluids and Zofran.  Hemodynamically stable without any pronounced tachycardia patient is remaining normotensive.  Given reassuring response to fluids and Zofran, anticipate the patient can safely discharged home with a continued prescription for Zofran.  Discussed return precautions such as worsening nausea and vomiting and signs indicating worsening dehydration.  Patient is agreeable with this plan and verbalized understanding all return precautions.  Final Clinical Impression(s) / ED Diagnoses Final diagnoses:  Nausea and vomiting, unspecified vomiting type  Dehydration    Rx / DC Orders ED Discharge Orders          Ordered    ondansetron (ZOFRAN-ODT) 4 MG disintegrating tablet  Every 8 hours PRN        09/09/23 2257    ondansetron (ZOFRAN) 4 MG tablet  Every 8 hours PRN        09/09/23 2258              Salomon Mast 09/09/23 2308    Terrilee Files,  MD 09/10/23 1048

## 2023-09-12 MED FILL — Ondansetron HCl Tab 4 MG: ORAL | Qty: 4 | Status: AC

## 2023-11-22 ENCOUNTER — Telehealth: Payer: Self-pay

## 2023-11-22 NOTE — Telephone Encounter (Signed)
 scheduled

## 2023-11-22 NOTE — Telephone Encounter (Signed)
 Patient was identified as falling into the [place] Measure - Diabetes.   Patient was: Requires a call back at a later time.  Last seen in August 2024. Patient meant to return in Dec 2024. Please contact patient for A1C/DM follow up appt with PCP.

## 2023-11-25 ENCOUNTER — Ambulatory Visit (INDEPENDENT_AMBULATORY_CARE_PROVIDER_SITE_OTHER): Admitting: Family Medicine

## 2023-11-25 ENCOUNTER — Encounter: Payer: Self-pay | Admitting: Family Medicine

## 2023-11-25 VITALS — BP 111/73 | HR 83 | Resp 16 | Ht 62.0 in | Wt 219.0 lb

## 2023-11-25 DIAGNOSIS — E559 Vitamin D deficiency, unspecified: Secondary | ICD-10-CM

## 2023-11-25 DIAGNOSIS — E1100 Type 2 diabetes mellitus with hyperosmolarity without nonketotic hyperglycemic-hyperosmolar coma (NKHHC): Secondary | ICD-10-CM | POA: Diagnosis not present

## 2023-11-25 DIAGNOSIS — R7301 Impaired fasting glucose: Secondary | ICD-10-CM | POA: Diagnosis not present

## 2023-11-25 DIAGNOSIS — E038 Other specified hypothyroidism: Secondary | ICD-10-CM | POA: Diagnosis not present

## 2023-11-25 DIAGNOSIS — I270 Primary pulmonary hypertension: Secondary | ICD-10-CM | POA: Diagnosis not present

## 2023-11-25 DIAGNOSIS — E1169 Type 2 diabetes mellitus with other specified complication: Secondary | ICD-10-CM | POA: Diagnosis not present

## 2023-11-25 DIAGNOSIS — E785 Hyperlipidemia, unspecified: Secondary | ICD-10-CM

## 2023-11-25 DIAGNOSIS — E7849 Other hyperlipidemia: Secondary | ICD-10-CM

## 2023-11-25 MED ORDER — FREESTYLE LIBRE 3 SENSOR MISC: 3 refills | Status: DC

## 2023-11-25 NOTE — Assessment & Plan Note (Signed)
 The patient admits to noncompliance with Ozempic and Glimepiride. She reports numbness and tingling in her hands, which prompted her to seek medical attention. Will assess Hemoglobin A1c today. Based on the results, we may initiate therapy with Mounjaro or restart Ozempic. Recommended reducing intake of high-sugar foods, including cakes, sweet desserts, ice cream, soda (regular or diet), sweet tea, candies, chips, cookies, store-bought juices, excessive alcohol (more than 1-2 drinks per day), lemonade, artificial sweeteners, donuts, coffee creamers, and sugar-free products and beverages. Encouraged increasing physical activity to at least 150 minutes per week of moderate-intensity exercise, as tolerated. Advised patient to seek urgent care if blood glucose exceeds 300 mg/dL or if symptoms of diabetic ketoacidosis occur, such as nausea, vomiting, abdominal pain, confusion, fruity-smelling breath, or rapid breathing.

## 2023-11-25 NOTE — Assessment & Plan Note (Signed)
 The patient admits that she has been noncompliant with oral statins Will assess cholesterol levels today. Based on the results, we may initiate therapy with Repatha 140 mg subcutaneous every two weeks. Advised reducing intake of greasy and fatty foods and increasing physical activity for cardiovascular health.

## 2023-11-25 NOTE — Assessment & Plan Note (Addendum)
 The patient reports a family history of pulmonary hypertension, noting that both her father and aunt died at the age of 35 with a similar diagnosis of pulmonary hypertension. The patient is currently asymptomatic but is interested in testing to assess for pulmonary hypertension. A referral will be placed to cardiology for further evaluation.

## 2023-11-25 NOTE — Patient Instructions (Addendum)
 I appreciate the opportunity to provide care to you today!    Follow up:  3 months  Labs: please stop by the lab today to get your blood drawn (CBC, CMP, TSH, Lipid profile, HgA1c, Vit D)  Type 2 Diabetes: Will assess your Hemoglobin A1c today. Based on the results, we may initiate therapy with Mounjaro or Ozempic. Recommended reducing intake of high-sugar foods, including cakes, sweet desserts, ice cream, soda (regular or diet), sweet tea, candies, chips, cookies, store-bought juices, excessive alcohol (more than 1-2 drinks per day), lemonade, artificial sweeteners, donuts, coffee creamers, and sugar-free products and beverages. Encourage increasing physical activity to at least 150 minutes per week of moderate-intensity exercise, as tolerated. Please seek urgent care if blood glucose exceeds 300 mg/dL or if symptoms of diabetic ketoacidosis occur, such as nausea, vomiting, abdominal pain, confusion, fruity-smelling breath, or rapid breathing.  Hyperlipidemia: Will assess cholesterol levels today. Based on the results, we may initiate therapy with Repatha 140 mg subcutaneous every two weeks. Advised reducing intake of greasy and fatty foods and increasing physical activity for cardiovascular health.  Here are some foods to avoid or reduce in your diet to help manage cholesterol levels:  Fried Foods:Deep-fried items such as french fries, fried chicken, and fried snacks are high in unhealthy fats and can raise LDL (bad) cholesterol levels. Processed Meats:Foods like bacon, sausage, hot dogs, and deli meats are often high in saturated fat and cholesterol. Full-Fat Dairy Products:Whole milk, full-fat yogurt, butter, cream, and cheese are rich in saturated fats, which can increase cholesterol levels. Baked Goods and Sweets:Pastries, cakes, cookies, and donuts often contain trans fats and added sugars, which can raise LDL cholesterol and lower HDL (good) cholesterol. Red Meat:Beef, lamb, and pork  are high in saturated fat. Lean cuts or plant-based protein alternatives are better options. Lard and Shortening:Used in some baked goods, lard and shortening are high in trans fats and should be avoided. Fast Food:Many fast food items are cooked with unhealthy oils and contain high amounts of saturated and trans fats. Processed Snacks:Chips, crackers, and certain microwave popcorns can contain trans fats and high levels of unhealthy oils. Shellfish:While nutritious in other ways, some shellfish like shrimp, lobster, and crab are high in cholesterol. They should be consumed in moderation. Coconut and Palm Oils:these oils are high in saturated fat and can raise cholesterol levels when used in cooking or baking.      Please continue to a heart-healthy diet and increase your physical activities. Try to exercise for at least five days a week.    It was a pleasure to see you and I look forward to continuing to work together on your health and well-being. Please do not hesitate to call the office if you need care or have questions about your care.  In case of emergency, please visit the Emergency Department for urgent care, or contact our clinic at [phone] to schedule an appointment. We're here to help you!   Have a wonderful day and week. With Gratitude, Gilmore Laroche MSN, FNP-BC

## 2023-11-25 NOTE — Progress Notes (Signed)
 Established Patient Office Visit  Subjective:  Patient ID: Alexandra Ortiz, female    DOB: 1981-02-10  Age: 43 y.o. MRN: [id number]  CC:  Chief Complaint  Patient presents with   Diabetes    Follow up visit - needs A1c rechecked     HPI Alexandra Ortiz is a 43 y.o. female with past medical history of type 2 diabetes and hyperlipidemia presents for f/u of  chronic medical conditions. For the details of today's visit, please refer to the assessment and plan.       Family history of pulmonary hypertension. Will like genetic testing. Father and mother died from pulmonary hypertension  mother Past Medical History:  Diagnosis Date   Diabetes mellitus without complication (HCC)     No past surgical history on file.  Family History  Problem Relation Age of Onset   Diabetes Mother    Diabetes Father    Hypertension Father    Heart failure Father     Social History   Socioeconomic History   Marital status: Married    Spouse name: Not on file   Number of children: Not on file   Years of education: Not on file   Highest education level: Not on file  Occupational History   Not on file  Tobacco Use   Smoking status: Never    Passive exposure: Past   Smokeless tobacco: Never  Vaping Use   Vaping status: Never Used  Substance and Sexual Activity   Alcohol use: Not Currently   Drug use: Yes    Types: Marijuana   Sexual activity: Yes  Other Topics Concern   Not on file  Social History Narrative   ** Merged History Encounter **       Social Drivers of Corporate investment banker Strain: Not on file  Food Insecurity: Not on file  Transportation Needs: Not on file  Physical Activity: Not on file  Stress: Not on file  Social Connections: Not on file  Intimate Partner Violence: Not on file    Outpatient Medications Prior to Visit  Medication Sig Dispense Refill   clonazePAM (KLONOPIN) 0.5 MG tablet Take 0.5 mg by mouth 2 (two) times daily as needed for anxiety.      lidocaine (LIDODERM) 5 % Place 1 patch onto the skin daily. Remove & Discard patch within 12 hours or as directed by MD 30 patch 0   naproxen (NAPROSYN) 500 MG tablet Take 1 tablet (500 mg total) by mouth 2 (two) times daily with a meal. (Patient not taking: Reported on 11/25/2023) 30 tablet 0   ondansetron (ZOFRAN) 4 MG tablet Take 1 tablet (4 mg total) by mouth every 8 (eight) hours as needed for nausea or vomiting. (Patient not taking: Reported on 11/25/2023) 4 tablet 0   ondansetron (ZOFRAN-ODT) 4 MG disintegrating tablet Take 1 tablet (4 mg total) by mouth every 8 (eight) hours as needed for nausea or vomiting. 20 tablet 0   Semaglutide, 1 MG/DOSE, 4 MG/3ML SOPN Inject 1 mg as directed once a week. (Patient not taking: Reported on 04/22/2023) 3 mL 0   No facility-administered medications prior to visit.    Allergies  Allergen Reactions   Tape    Wound Dressing Adhesive Hives    ROS Review of Systems  Constitutional:  Negative for chills and fever.  Eyes:  Negative for visual disturbance.  Respiratory:  Negative for chest tightness and shortness of breath.   Neurological:  Negative for dizziness and headaches.  Objective:    Physical Exam HENT:     Head: Normocephalic.     Mouth/Throat:     Mouth: Mucous membranes are moist.  Cardiovascular:     Rate and Rhythm: Normal rate.     Heart sounds: Normal heart sounds.  Pulmonary:     Effort: Pulmonary effort is normal.     Breath sounds: Normal breath sounds.  Neurological:     Mental Status: She is alert.     BP 111/73   Pulse 83   Resp 16   Ht 5\' 2"  (1.575 m)   Wt 219 lb (99.3 kg)   SpO2 94%   BMI 40.06 kg/m  Wt Readings from Last 3 Encounters:  11/25/23 219 lb (99.3 kg)  09/09/23 215 lb (97.5 kg)  04/22/23 221 lb (100.2 kg)    Lab Results  Component Value Date   TSH 1.700 12/29/2022   Lab Results  Component Value Date   WBC 8.6 09/09/2023   HGB 12.8 09/09/2023   HCT 40.2 09/09/2023   MCV 73.9 (L)  09/09/2023   PLT 307 09/09/2023   Lab Results  Component Value Date   NA 132 (L) 09/09/2023   K 3.8 09/09/2023   CO2 25 09/09/2023   GLUCOSE 308 (H) 09/09/2023   BUN 8 09/09/2023   CREATININE 0.49 09/09/2023   BILITOT 0.7 09/09/2023   ALKPHOS 74 09/09/2023   AST 16 09/09/2023   ALT 16 09/09/2023   PROT 7.6 09/09/2023   ALBUMIN 3.9 09/09/2023   CALCIUM 8.8 (L) 09/09/2023   ANIONGAP 7 09/09/2023   EGFR 113 12/29/2022   Lab Results  Component Value Date   CHOL 202 (H) 12/29/2022   Lab Results  Component Value Date   HDL 37 (L) 12/29/2022   Lab Results  Component Value Date   LDLCALC 123 (H) 12/29/2022   Lab Results  Component Value Date   TRIG 239 (H) 12/29/2022   Lab Results  Component Value Date   CHOLHDL 5.5 (H) 12/29/2022   Lab Results  Component Value Date   HGBA1C 13.7 (H) 01/14/2023      Assessment & Plan:  Type 2 diabetes mellitus with hyperosmolarity without coma, without long-term current use of insulin (HCC) Assessment & Plan: The patient admits to noncompliance with Ozempic and Glimepiride. She reports numbness and tingling in her hands, which prompted her to seek medical attention. Will assess Hemoglobin A1c today. Based on the results, we may initiate therapy with Mounjaro or restart Ozempic. Recommended reducing intake of high-sugar foods, including cakes, sweet desserts, ice cream, soda (regular or diet), sweet tea, candies, chips, cookies, store-bought juices, excessive alcohol (more than 1-2 drinks per day), lemonade, artificial sweeteners, donuts, coffee creamers, and sugar-free products and beverages. Encouraged increasing physical activity to at least 150 minutes per week of moderate-intensity exercise, as tolerated. Advised patient to seek urgent care if blood glucose exceeds 300 mg/dL or if symptoms of diabetic ketoacidosis occur, such as nausea, vomiting, abdominal pain, confusion, fruity-smelling breath, or rapid breathing.   Orders: -      FreeStyle Libre 3 Sensor; Place 1 sensor on the skin every 14 days. Use to check glucose continuously  Dispense: 2 each; Refill: 3  Hyperlipidemia associated with type 2 diabetes mellitus (HCC) Assessment & Plan: The patient admits that she has been noncompliant with oral statins Will assess cholesterol levels today. Based on the results, we may initiate therapy with Repatha 140 mg subcutaneous every two weeks. Advised reducing intake of greasy and  fatty foods and increasing physical activity for cardiovascular health.    Pulmonary hypertension, primary (HCC) Assessment & Plan: The patient reports a family history of pulmonary hypertension, noting that both her father and aunt died at the age of 93 with a similar diagnosis of pulmonary hypertension. The patient is currently asymptomatic but is interested in testing to assess for pulmonary hypertension. A referral will be placed to cardiology for further evaluation.   Orders: -     Ambulatory referral to Cardiology  IFG (impaired fasting glucose) -     Hemoglobin A1c  Vitamin D deficiency -     VITAMIN D 25 Hydroxy (Vit-D Deficiency, Fractures)  TSH (thyroid-stimulating hormone deficiency) -     TSH + free T4  Other hyperlipidemia -     Lipid panel -     CMP14+EGFR -     CBC with Differential/Platelet  Note: This chart has been completed using Engineer, civil (consulting) software, and while attempts have been made to ensure accuracy, certain words and phrases may not be transcribed as intended.    Follow-up: Return in about 3 months (around 02/24/2024).   Gilmore Laroche, FNP

## 2023-11-29 LAB — CBC WITH DIFFERENTIAL/PLATELET
Basophils Absolute: 0 10*3/uL (ref 0.0–0.2)
Basos: 1 %
EOS (ABSOLUTE): 0.2 10*3/uL (ref 0.0–0.4)
Eos: 3 %
Hematocrit: 44.6 % (ref 34.0–46.6)
Hemoglobin: 13.7 g/dL (ref 11.1–15.9)
Immature Grans (Abs): 0 10*3/uL (ref 0.0–0.1)
Immature Granulocytes: 0 %
Lymphocytes Absolute: 1.8 10*3/uL (ref 0.7–3.1)
Lymphs: 27 %
MCH: 23.2 pg — ABNORMAL LOW (ref 26.6–33.0)
MCHC: 30.7 g/dL — ABNORMAL LOW (ref 31.5–35.7)
MCV: 76 fL — ABNORMAL LOW (ref 79–97)
Monocytes Absolute: 0.5 10*3/uL (ref 0.1–0.9)
Monocytes: 8 %
Neutrophils Absolute: 4.1 10*3/uL (ref 1.4–7.0)
Neutrophils: 61 %
Platelets: 299 10*3/uL (ref 150–450)
RBC: 5.9 x10E6/uL — ABNORMAL HIGH (ref 3.77–5.28)
RDW: 12.7 % (ref 11.7–15.4)
WBC: 6.7 10*3/uL (ref 3.4–10.8)

## 2023-11-29 LAB — CMP14+EGFR
ALT: 13 IU/L (ref 0–32)
AST: 6 IU/L (ref 0–40)
Albumin: 4.4 g/dL (ref 3.9–4.9)
Alkaline Phosphatase: 107 IU/L (ref 44–121)
BUN/Creatinine Ratio: 20 (ref 9–23)
BUN: 13 mg/dL (ref 6–24)
Bilirubin Total: 0.2 mg/dL (ref 0.0–1.2)
CO2: 22 mmol/L (ref 20–29)
Calcium: 9.6 mg/dL (ref 8.7–10.2)
Chloride: 99 mmol/L (ref 96–106)
Creatinine, Ser: 0.64 mg/dL (ref 0.57–1.00)
Globulin, Total: 3 g/dL (ref 1.5–4.5)
Glucose: 330 mg/dL — ABNORMAL HIGH (ref 70–99)
Potassium: 4.3 mmol/L (ref 3.5–5.2)
Sodium: 135 mmol/L (ref 134–144)
Total Protein: 7.4 g/dL (ref 6.0–8.5)
eGFR: 113 mL/min/{1.73_m2} (ref >59)

## 2023-11-29 LAB — TSH+FREE T4
Free T4: 1.16 ng/dL (ref 0.82–1.77)
TSH: 1.16 u[IU]/mL (ref 0.450–4.500)

## 2023-11-29 LAB — LIPID PANEL
Chol/HDL Ratio: 5.1 ratio — ABNORMAL HIGH (ref 0.0–4.4)
Cholesterol, Total: 221 mg/dL — ABNORMAL HIGH (ref 100–199)
HDL: 43 mg/dL (ref >39)
LDL Chol Calc (NIH): 144 mg/dL — ABNORMAL HIGH (ref 0–99)
Triglycerides: 188 mg/dL — ABNORMAL HIGH (ref 0–149)
VLDL Cholesterol Cal: 34 mg/dL (ref 5–40)

## 2023-11-29 LAB — HEMOGLOBIN A1C
Est. average glucose Bld gHb Est-mCnc: 352 mg/dL
Hgb A1c MFr Bld: 13.9 % — ABNORMAL HIGH (ref 4.8–5.6)

## 2023-11-29 LAB — VITAMIN D 25 HYDROXY (VIT D DEFICIENCY, FRACTURES): Vit D, 25-Hydroxy: 15.9 ng/mL — ABNORMAL LOW (ref 30.0–100.0)

## 2023-12-01 ENCOUNTER — Encounter: Payer: Self-pay | Admitting: Family Medicine

## 2023-12-01 ENCOUNTER — Other Ambulatory Visit: Payer: Self-pay | Admitting: Family Medicine

## 2023-12-01 DIAGNOSIS — E559 Vitamin D deficiency, unspecified: Secondary | ICD-10-CM

## 2023-12-01 DIAGNOSIS — E785 Hyperlipidemia, unspecified: Secondary | ICD-10-CM

## 2023-12-01 DIAGNOSIS — E1100 Type 2 diabetes mellitus with hyperosmolarity without nonketotic hyperglycemic-hyperosmolar coma (NKHHC): Secondary | ICD-10-CM

## 2023-12-01 MED ORDER — TIRZEPATIDE 2.5 MG/0.5ML ~~LOC~~ SOAJ: 2.5000 mg | SUBCUTANEOUS | 0 refills | Status: DC

## 2023-12-01 MED ORDER — VITAMIN D (ERGOCALCIFEROL) 1.25 MG (50000 UNIT) PO CAPS: 50000.0000 [IU] | ORAL_CAPSULE | ORAL | 1 refills | Status: DC

## 2023-12-01 MED ORDER — FREESTYLE LIBRE 3 SENSOR MISC: 3 refills | Status: DC

## 2023-12-01 MED ORDER — ROSUVASTATIN CALCIUM 10 MG PO TABS: 10.0000 mg | ORAL_TABLET | Freq: Every day | ORAL | 1 refills | Status: DC

## 2023-12-01 MED ORDER — GLIMEPIRIDE 2 MG PO TABS: 2.0000 mg | ORAL_TABLET | Freq: Every day | ORAL | 3 refills | Status: DC

## 2023-12-01 NOTE — Telephone Encounter (Signed)
 Provider responded to patient in separate thread.

## 2023-12-07 ENCOUNTER — Other Ambulatory Visit: Payer: Self-pay

## 2023-12-07 ENCOUNTER — Emergency Department (HOSPITAL_COMMUNITY)
Admission: EM | Admit: 2023-12-07 | Discharge: 2023-12-07 | Disposition: A | Discharge status: Home / Self Care | Attending: Emergency Medicine | Admitting: Emergency Medicine

## 2023-12-07 ENCOUNTER — Encounter (HOSPITAL_COMMUNITY): Payer: Self-pay | Admitting: Emergency Medicine

## 2023-12-07 DIAGNOSIS — Z7984 Long term (current) use of oral hypoglycemic drugs: Secondary | ICD-10-CM | POA: Insufficient documentation

## 2023-12-07 DIAGNOSIS — R739 Hyperglycemia, unspecified: Secondary | ICD-10-CM | POA: Diagnosis not present

## 2023-12-07 DIAGNOSIS — E1165 Type 2 diabetes mellitus with hyperglycemia: Secondary | ICD-10-CM | POA: Insufficient documentation

## 2023-12-07 LAB — URINALYSIS, ROUTINE W REFLEX MICROSCOPIC
Bacteria, UA: NONE SEEN
Bilirubin Urine: NEGATIVE
Glucose, UA: >=500 mg/dL — AB
Hgb urine dipstick: NEGATIVE
Ketones, ur: NEGATIVE mg/dL
Nitrite: NEGATIVE
Protein, ur: NEGATIVE mg/dL
Specific Gravity, Urine: 1.025 (ref 1.005–1.030)
pH: 5 (ref 5.0–8.0)

## 2023-12-07 LAB — CBC WITH DIFFERENTIAL/PLATELET
Abs Immature Granulocytes: 0.04 10*3/uL (ref 0.00–0.07)
Basophils Absolute: 0.1 10*3/uL (ref 0.0–0.1)
Basophils Relative: 1 %
Eosinophils Absolute: 0.2 10*3/uL (ref 0.0–0.5)
Eosinophils Relative: 3 %
HCT: 41.9 % (ref 36.0–46.0)
Hemoglobin: 13.1 g/dL (ref 12.0–15.0)
Immature Granulocytes: 1 %
Lymphocytes Relative: 26 %
Lymphs Abs: 1.7 10*3/uL (ref 0.7–4.0)
MCH: 22.9 pg — ABNORMAL LOW (ref 26.0–34.0)
MCHC: 31.3 g/dL (ref 30.0–36.0)
MCV: 73.3 fL — ABNORMAL LOW (ref 80.0–100.0)
Monocytes Absolute: 0.4 10*3/uL (ref 0.1–1.0)
Monocytes Relative: 7 %
Neutro Abs: 4.1 10*3/uL (ref 1.7–7.7)
Neutrophils Relative %: 62 %
Platelets: 301 10*3/uL (ref 150–400)
RBC: 5.72 MIL/uL — ABNORMAL HIGH (ref 3.87–5.11)
RDW: 13.1 % (ref 11.5–15.5)
WBC: 6.5 10*3/uL (ref 4.0–10.5)
nRBC: 0 % (ref 0.0–0.2)

## 2023-12-07 LAB — BASIC METABOLIC PANEL WITH GFR
Anion gap: 12 (ref 5–15)
BUN: 9 mg/dL (ref 6–20)
CO2: 23 mmol/L (ref 22–32)
Calcium: 9.2 mg/dL (ref 8.9–10.3)
Chloride: 99 mmol/L (ref 98–111)
Creatinine, Ser: 0.44 mg/dL (ref 0.44–1.00)
GFR, Estimated: >60 mL/min (ref >60)
Glucose, Bld: 291 mg/dL — ABNORMAL HIGH (ref 70–99)
Potassium: 3.7 mmol/L (ref 3.5–5.1)
Sodium: 134 mmol/L — ABNORMAL LOW (ref 135–145)

## 2023-12-07 LAB — CBG MONITORING, ED
Glucose-Capillary: 304 mg/dL — ABNORMAL HIGH (ref 70–99)
Glucose-Capillary: 288 mg/dL — ABNORMAL HIGH (ref 70–99)

## 2023-12-07 LAB — PREGNANCY, URINE: Preg Test, Ur: NEGATIVE

## 2023-12-07 MED ORDER — LACTATED RINGERS IV BOLUS
1000.0000 mL | Freq: Once | INTRAVENOUS | Status: AC
  Administered 2023-12-07: 1000 mL via INTRAVENOUS

## 2023-12-07 NOTE — ED Notes (Signed)
 Patient discharged. Provider spoke to patient. Paperwork given to patient and reviewed. Pt verbalized understanding. VSS. A+Ox4. Patient ambulated out of the ER with steady, independent gait. Iv removed intact without complications.

## 2023-12-07 NOTE — Discharge Instructions (Signed)
 It was a pleasure taking care of you today.  You are seen in the ER for evaluation of high blood sugar which is improved with IV fluids.  Continue drinking lots of water, avoiding foods with high sugar content especially juice, soda, sweetened tea, cereals, white bread and pasta.  Follow-up with your doctor to see if they want to increase your glimepiride dose since you are not able to afford the extremely expensive Mounjaro.  Back to ER if you have any new or worsening symptoms.

## 2023-12-07 NOTE — ED Provider Notes (Signed)
 [place] EMERGENCY DEPARTMENT AT [facility] Provider Note   CSN: [id number] Arrival date & time: 12/07/23  1253     History  Chief Complaint  Patient presents with   Hyperglycemia    Alexandra Ortiz is a 43 y.o. female.  She has PMH high cholesterol, type 2 diabetes, diagnosed 10 years ago, currently on glimepiride , recently prescribed Mounjaro but states it is $1000 a month due to her insurance deductible so she has not been able to afford this.  She states for the past week her blood sugars have been consistently above 350.  Called her PCP who advised coming to the ER if it remained elevated.  She does admit to some tingling in her fingers and toes bilaterally but no chest pain or shortness of breath, no urinary frequency or dysuria, no nausea or vomiting, fevers or chills or other complaints.  Patient reports she has been on Invokana in the past as well as metformin .  She has also been prescribed Ozempic  previously.  She states the metformin  gives her significant GI upset and she has never been able to tolerate this.  She states that he is never been on insulin.  Been trying to control her sugars at home by drinking lots of water and restricting her carbohydrate intake but states that has not helped.   Hyperglycemia      Home Medications Prior to Admission medications   Medication Sig Start Date End Date Taking? Authorizing Provider  clonazePAM (KLONOPIN) 0.5 MG tablet Take 0.5 mg by mouth 2 (two) times daily as needed for anxiety.    [provider]  Continuous Glucose Sensor (FREESTYLE LIBRE 3 SENSOR) MISC Place 1 sensor on the skin every 14 days. Use to check glucose continuously 12/01/23   Zarwolo, Gloria, FNP  glimepiride  (AMARYL ) 2 MG tablet Take 1 tablet (2 mg total) by mouth daily before breakfast. 12/01/23   Zarwolo, Gloria, FNP  naproxen  (NAPROSYN ) 500 MG tablet Take 1 tablet (500 mg total) by mouth 2 (two) times daily with a meal. Patient not  taking: Reported on 11/25/2023 07/29/23   Zarwolo, Gloria, FNP  ondansetron  (ZOFRAN ) 4 MG tablet Take 1 tablet (4 mg total) by mouth every 8 (eight) hours as needed for nausea or vomiting. Patient not taking: Reported on 11/25/2023 09/09/23   Zelaya, Oscar A, PA-C  rosuvastatin  (CRESTOR ) 10 MG tablet Take 1 tablet (10 mg total) by mouth daily. 12/01/23   Zarwolo, Gloria, FNP  tirzepatide [facility]) 2.5 MG/0.5ML Pen Inject 2.5 mg into the skin once a week. 12/01/23   Zarwolo, Gloria, FNP  Vitamin D , Ergocalciferol , (DRISDOL ) 1.25 MG (50000 UNIT) CAPS capsule Take 1 capsule (50,000 Units total) by mouth every 7 (seven) days. 12/01/23   Zarwolo, Gloria, FNP      Allergies    Tape and Wound dressing adhesive    Review of Systems   Review of Systems  Physical Exam Updated Vital Signs BP (!) 153/79 (BP Location: Left Arm)   Pulse 95   Temp 99.2 F (37.3 C) (Oral)   Resp 16   Ht 5' 2.5" (1.588 m)   Wt 99.8 kg   SpO2 98%   BMI 39.60 kg/m  Physical Exam  ED Results / Procedures / Treatments   Labs (all labs ordered are listed, but only abnormal results are displayed) Labs Reviewed  URINALYSIS, ROUTINE W REFLEX MICROSCOPIC - Abnormal; Notable for the following components:      Result Value   APPearance HAZY (*)  Glucose, UA >=500 (*)    Leukocytes,Ua TRACE (*)    All other components within normal limits  BASIC METABOLIC PANEL WITH GFR - Abnormal; Notable for the following components:   Sodium 134 (*)    Glucose, Bld 291 (*)    All other components within normal limits  CBC WITH DIFFERENTIAL/PLATELET - Abnormal; Notable for the following components:   RBC 5.72 (*)    MCV 73.3 (*)    MCH 22.9 (*)    All other components within normal limits  CBG MONITORING, ED - Abnormal; Notable for the following components:   Glucose-Capillary 304 (*)    All other components within normal limits  CBG MONITORING, ED - Abnormal; Notable for the following components:   Glucose-Capillary 288 (*)     All other components within normal limits  PREGNANCY, URINE  CBG MONITORING, ED    EKG None  Radiology No results found.  Procedures Procedures    Medications Ordered in ED Medications  lactated ringers  bolus 1,000 mL (1,000 mLs Intravenous New Bag/Given 12/07/23 1413)    ED Course/ Medical Decision Making/ A&P                                 Medical Decision Making This patient presents to the ED for concern of elevated blood sugars, this involves an extensive number of treatment options, and is a complaint that carries with it a high risk of complications and morbidity.  The differential diagnosis includes glycemia, DKA, HHS, other   Co morbidities that complicate the patient evaluation  Diabetes, high cholesterol   Additional history obtained:  Additional history obtained from EMR External records from outside source obtained and reviewed including prior notes and labs   Lab Tests:  I Ordered, and personally interpreted labs.  The pertinent results include: UA does show glucosuria but no UTI, BMP shows glucose 291, normal renal function, CBC shows no anemia or leukocytosis     Problem List / ED Course / Critical interventions / Medication management  Patient here for hyperglycemia over the past week, unable to forward her injectable diabetes medicine but is compliant with her oral medication.  Not able to tolerate metformin  due to GI side effects she reports.  She has been controlling her diet at home by prior to eating and meat with salad, she does use regular dressing.  We discussed she could try sugar-free dressing.  Blood sugar improved here with liter of fluids, on her CGM right now blood sugar is 245, she is is the lowest she has seen in over a week and she is satisfied.  Advised to call her PCP tomorrow to see if they want to increase her medications.  Given strict return precautions. I ordered medication including a liter of LR for upper  glycemia Reevaluation of the patient after these medicines showed that the patient improved I have reviewed the patients home medicines and have made adjustments as needed      Amount and/or Complexity of Data Reviewed Labs: ordered.           Final Clinical Impression(s) / ED Diagnoses Final diagnoses:  None    Rx / DC Orders ED Discharge Orders     None         Joshua Nieves 12/07/23 1637    Ninetta Basket, MD 12/09/23 [phone]

## 2023-12-07 NOTE — ED Triage Notes (Signed)
 Pt states her cbg has been over 300 x3 days. Denies any other symptoms. Does not take insulin.

## 2024-01-06 ENCOUNTER — Other Ambulatory Visit: Payer: Self-pay

## 2024-01-06 DIAGNOSIS — E1100 Type 2 diabetes mellitus with hyperosmolarity without nonketotic hyperglycemic-hyperosmolar coma (NKHHC): Secondary | ICD-10-CM

## 2024-01-06 MED ORDER — TIRZEPATIDE 5 MG/0.5ML ~~LOC~~ SOAJ: 5.0000 mg | SUBCUTANEOUS | 1 refills | Status: DC

## 2024-02-02 ENCOUNTER — Encounter: Payer: Self-pay | Admitting: Internal Medicine

## 2024-02-02 ENCOUNTER — Ambulatory Visit: Attending: Internal Medicine | Admitting: Internal Medicine

## 2024-02-02 VITALS — BP 114/70 | HR 90 | Ht 62.5 in | Wt 228.4 lb

## 2024-02-02 DIAGNOSIS — Z8249 Family history of ischemic heart disease and other diseases of the circulatory system: Secondary | ICD-10-CM | POA: Diagnosis not present

## 2024-02-02 DIAGNOSIS — Z136 Encounter for screening for cardiovascular disorders: Secondary | ICD-10-CM

## 2024-02-02 NOTE — Patient Instructions (Signed)
 Medication Instructions:  Your physician recommends that you continue on your current medications as directed. Please refer to the Current Medication list given to you today.   Labwork: None  Testing/Procedures: Your physician has requested that you have an echocardiogram. Echocardiography is a painless test that uses sound waves to create images of your heart. It provides your doctor with information about the size and shape of your heart and how well your heart's chambers and valves are working. This procedure takes approximately one hour. There are no restrictions for this procedure. Please do NOT wear cologne, perfume, aftershave, or lotions (deodorant is allowed). Please arrive 15 minutes prior to your appointment time.  Please note: We ask at that you not bring children with you during ultrasound (echo/ vascular) testing. Due to room size and safety concerns, children are not allowed in the ultrasound rooms during exams. Our front office staff cannot provide observation of children in our lobby area while testing is being conducted. An adult accompanying a patient to their appointment will only be allowed in the ultrasound room at the discretion of the ultrasound technician under special circumstances. We apologize for any inconvenience.   Follow-Up: Your physician recommends that you schedule a follow-up appointment in: Pending Results  Any Other Special Instructions Will Be Listed Below (If Applicable).  Thank you for choosing [facility]!     If you need a refill on your cardiac medications before your next appointment, please call your pharmacy.

## 2024-02-02 NOTE — Progress Notes (Signed)
 Cardiology Office Note  Date: 02/02/2024   ID: Alexandra Ortiz, DOB Jan 23, 1981, MRN [id number]  PCP:  Zarwolo, Gloria, FNP  Cardiologist:  Lasalle Pointer, MD Electrophysiologist:  None   History of Present Illness: Alexandra Ortiz is a 42 y.o. female  Referred to cardiology clinic for the evaluation of pulmonary hypertension.  Patient has a family history of pulmonary hypertension, her father and aunt passed away with pulmonary hypertension in the age of 65s.  She is currently 44 years old and wants to take preventive measures.  She is asymptomatic, does not have any symptoms of angina, DOE, dizziness, syncope.  She does have leg swelling when she is on her feet all day long but this resolves when she elevates her legs, wrist per the end of the day.  Past Medical History:  Diagnosis Date   Diabetes mellitus without complication (HCC)     No past surgical history on file.  Current Outpatient Medications  Medication Sig Dispense Refill   Continuous Glucose Sensor (FREESTYLE LIBRE 3 SENSOR) MISC Place 1 sensor on the skin every 14 days. Use to check glucose continuously 6 each 3   glimepiride  (AMARYL ) 2 MG tablet Take 1 tablet (2 mg total) by mouth daily before breakfast. 30 tablet 3   ibuprofen  (ADVIL ) 400 MG tablet Take 400 mg by mouth every 4 (four) hours as needed.     ondansetron  (ZOFRAN -ODT) 4 MG disintegrating tablet Take 4 mg by mouth every 8 (eight) hours as needed.     rosuvastatin  (CRESTOR ) 10 MG tablet Take 1 tablet (10 mg total) by mouth daily. 90 tablet 1   tirzepatide (MOUNJARO) 5 MG/0.5ML Pen INJECT 5 MG SUBCUTANEOUSLY WEEKLY 2 mL 1   Vitamin D , Ergocalciferol , (DRISDOL ) 1.25 MG (50000 UNIT) CAPS capsule Take 1 capsule (50,000 Units total) by mouth every 7 (seven) days. 20 capsule 1   clonazePAM (KLONOPIN) 0.5 MG tablet Take 0.5 mg by mouth 2 (two) times daily as needed for anxiety.     No current facility-administered medications for this visit.    Allergies:  Tape and Wound dressing adhesive   Social History: The patient  reports that she has never smoked. She has been exposed to tobacco smoke. She has never used smokeless tobacco. She reports that she does not currently use alcohol. She reports current drug use. Drug: Marijuana.   Family History: The patient's family history includes Diabetes in her father and mother; Heart failure in her father; Hypertension in her father.   ROS:  Please see the history of present illness. Otherwise, complete review of systems is positive for none  All other systems are reviewed and negative.   Physical Exam: VS:  BP 114/70   Pulse 90   Ht 5' 2.5 (1.588 m)   Wt 228 lb 6.4 oz (103.6 kg)   SpO2 95%   BMI 41.11 kg/m , BMI Body mass index is 41.11 kg/m.  Wt Readings from Last 3 Encounters:  02/02/24 228 lb 6.4 oz (103.6 kg)  12/07/23 220 lb (99.8 kg)  11/25/23 219 lb (99.3 kg)    General: Patient appears comfortable at rest. HEENT: Conjunctiva and lids normal, oropharynx clear with moist mucosa. Neck: Supple, no elevated JVP or carotid bruits, no thyromegaly. Lungs: Clear to auscultation, nonlabored breathing at rest. Cardiac: Regular rate and rhythm, no S3 or significant systolic murmur, no pericardial rub. Abdomen: Soft, nontender, no hepatomegaly, bowel sounds present, no guarding or rebound. Extremities: No pitting edema, distal pulses 2+. Skin: Warm and  dry. Musculoskeletal: No kyphosis. Neuropsychiatric: Alert and oriented x3, affect grossly appropriate.  Recent Labwork: 11/25/2023: ALT 13; AST 6; TSH 1.160 12/07/2023: BUN 9; Creatinine, Ser 0.44; Hemoglobin 13.1; Platelets 301; Potassium 3.7; Sodium 134     Component Value Date/Time   CHOL 221 (H) 11/25/2023 1032   TRIG 188 (H) 11/25/2023 1032   HDL 43 11/25/2023 1032   CHOLHDL 5.1 (H) 11/25/2023 1032   LDLCALC 144 (H) 11/25/2023 1032    Other Studies Reviewed Today:   Assessment and Plan:  Family history of pulmonary  hypertension: Patient asymptomatic.  Father and aunt passed away with history of pulmonary hypertension.  Does not know more details regarding this.  Will obtain echocardiogram.  Discussed symptoms of pulmonary hypertension.       Medication Adjustments/Labs and Tests Ordered: Current medicines are reviewed at length with the patient today.  Concerns regarding medicines are outlined above.    Disposition:  Follow up pending results  Signed Xandra Laramee Priya Aikeem Lilley, MD, 02/02/2024 2:35 PM    [facility] at [facility] [address] [place], [place], [place] [address]

## 2024-02-06 ENCOUNTER — Telehealth: Payer: Self-pay | Admitting: Family Medicine

## 2024-02-06 NOTE — Telephone Encounter (Signed)
 LVM for pt to call and schedule DRS

## 2024-02-21 ENCOUNTER — Encounter: Payer: Self-pay | Admitting: Family Medicine

## 2024-02-22 ENCOUNTER — Ambulatory Visit: Attending: Internal Medicine

## 2024-02-22 ENCOUNTER — Ambulatory Visit: Payer: Self-pay

## 2024-02-22 DIAGNOSIS — Z8249 Family history of ischemic heart disease and other diseases of the circulatory system: Secondary | ICD-10-CM

## 2024-02-22 NOTE — Telephone Encounter (Addendum)
 Call was disconnected during disposition/education. Attempted to call back, phone states that call cannot be completed as dialed.   Pt states that since she has switched to mounjaro  she feels her blood sugar has been continually high, she does not crave water, and has been gaining weight. Pt would like to be placed back on Ozempic .   FYI Only or Action Required?: Action required by provider: clinical question for provider.  Patient was last seen in primary care on 11/25/2023 by Zarwolo, Gloria, FNP. Called Nurse Triage reporting Hyperglycemia. Symptoms began several weeks ago. Interventions attempted: Rest, hydration, or home remedies. Symptoms are: unchanged.  Triage Disposition: Home Care  Patient/caregiver understands and will follow disposition?: Unsure  Copied from CRM [id number]. Topic: Clinical - Red Word Triage >> Feb 22, 2024  9:54 AM Nathanel BROCKS wrote: Red Word that prompted transfer to Nurse Triage:   Pt called and stated that even with medication her glucose is running really high. Yesterday it was 263 and about the same today. Reason for Disposition  [1] Blood glucose 240 - 300 mg/dL (86.6 - 83.2 mmol/L) AND [2] does not  use insulin (e.g., not insulin-dependent; most people with type 2 diabetes)  Answer Assessment - Initial Assessment Questions 1. BLOOD GLUCOSE: What is your blood glucose level?      247 2. ONSET: When did you check the blood glucose?     Has been above 200 for about a week 3. USUAL RANGE: What is your glucose level usually? (e.g., usual fasting morning value, usual evening value)     150  4. KETONES: Do you check for ketones (urine or blood test strips)? If Yes, ask: What does the test show now?      denies 5. TYPE 1 or 2:  Do you know what type of diabetes you have?  (e.g., Type 1, Type 2, Gestational; doesn't know)      Type 2 6. INSULIN: Do you take insulin? What type of insulin(s) do you use? What is the mode of delivery? (syringe, pen;  injection or pump)?      denies 7. DIABETES PILLS: Do you take any pills for your diabetes? If Yes, ask: Have you missed taking any pills recently?     Mounjaro , and glipizide, no change in dosing or taking the meds 8. OTHER SYMPTOMS: Do you have any symptoms? (e.g., fever, frequent urination, difficulty breathing, dizziness, weakness, vomiting)     denies  Protocols used: Diabetes - High Blood Sugar-A-AH

## 2024-02-23 ENCOUNTER — Other Ambulatory Visit: Payer: Self-pay | Admitting: Family Medicine

## 2024-02-23 DIAGNOSIS — E1100 Type 2 diabetes mellitus with hyperosmolarity without nonketotic hyperglycemic-hyperosmolar coma (NKHHC): Secondary | ICD-10-CM

## 2024-02-23 LAB — ECHOCARDIOGRAM COMPLETE
AR max vel: 4.06 cm2
AV Area VTI: 3.7 cm2
AV Area mean vel: 3.7 cm2
AV Mean grad: 4 mmHg
AV Peak grad: 7.3 mmHg
Ao pk vel: 1.35 m/s
Area-P 1/2: 4.86 cm2
Calc EF: 66.1 %
MV VTI: 3.29 cm2
S' Lateral: 2.7 cm
Single Plane A2C EF: 59.6 %
Single Plane A4C EF: 72.9 %

## 2024-02-23 MED ORDER — TIRZEPATIDE 7.5 MG/0.5ML ~~LOC~~ SOAJ: 7.5000 mg | SUBCUTANEOUS | 0 refills | Status: DC

## 2024-02-23 MED ORDER — GLIMEPIRIDE 4 MG PO TABS: 4.0000 mg | ORAL_TABLET | Freq: Every day | ORAL | 3 refills | Status: AC

## 2024-03-02 ENCOUNTER — Encounter: Payer: Self-pay | Admitting: Family Medicine

## 2024-03-02 ENCOUNTER — Ambulatory Visit (INDEPENDENT_AMBULATORY_CARE_PROVIDER_SITE_OTHER): Payer: Self-pay | Admitting: Family Medicine

## 2024-03-02 VITALS — BP 122/70 | HR 66 | Ht 62.5 in | Wt 231.6 lb

## 2024-03-02 DIAGNOSIS — E1100 Type 2 diabetes mellitus with hyperosmolarity without nonketotic hyperglycemic-hyperosmolar coma (NKHHC): Secondary | ICD-10-CM

## 2024-03-02 DIAGNOSIS — E038 Other specified hypothyroidism: Secondary | ICD-10-CM

## 2024-03-02 DIAGNOSIS — E1169 Type 2 diabetes mellitus with other specified complication: Secondary | ICD-10-CM

## 2024-03-02 DIAGNOSIS — E785 Hyperlipidemia, unspecified: Secondary | ICD-10-CM | POA: Diagnosis not present

## 2024-03-02 DIAGNOSIS — E559 Vitamin D deficiency, unspecified: Secondary | ICD-10-CM

## 2024-03-02 NOTE — Progress Notes (Signed)
 Established Patient Office Visit  Subjective:  Patient ID: Alexandra Ortiz, female    DOB: Feb 09, 1981  Age: 43 y.o. MRN: [id number]  CC:  Chief Complaint  Patient presents with   Diabetes    Three month follow up    Obesity    Concerned for weight gain     HPI Alexandra Ortiz is a 43 y.o. female with past medical history of Type 2 diabetes, hyperlipidemia, vitamin D  deficiency presents for f/u of  chronic medical conditions. For the details of today's visit, please refer to the assessment and plan.    Past Medical History:  Diagnosis Date   Diabetes mellitus without complication (HCC)     No past surgical history on file.  Family History  Problem Relation Age of Onset   Diabetes Mother    Diabetes Father    Hypertension Father    Heart failure Father     Social History   Socioeconomic History   Marital status: Married    Spouse name: Not on file   Number of children: Not on file   Years of education: Not on file   Highest education level: Not on file  Occupational History   Not on file  Tobacco Use   Smoking status: Never    Passive exposure: Past   Smokeless tobacco: Never  Vaping Use   Vaping status: Never Used  Substance and Sexual Activity   Alcohol use: Not Currently   Drug use: Yes    Types: Marijuana   Sexual activity: Yes  Other Topics Concern   Not on file  Social History Narrative   ** Merged History Encounter **       Social Drivers of Corporate investment banker Strain: Not on file  Food Insecurity: Not on file  Transportation Needs: Not on file  Physical Activity: Not on file  Stress: Not on file  Social Connections: Not on file  Intimate Partner Violence: Not on file    Outpatient Medications Prior to Visit  Medication Sig Dispense Refill   clonazePAM (KLONOPIN) 0.5 MG tablet Take 0.5 mg by mouth 2 (two) times daily as needed for anxiety.     Continuous Glucose Sensor (FREESTYLE LIBRE 3 SENSOR) MISC Place 1 sensor on the skin  every 14 days. Use to check glucose continuously 6 each 3   glimepiride  (AMARYL ) 4 MG tablet Take 1 tablet (4 mg total) by mouth daily before breakfast. 60 tablet 3   ibuprofen  (ADVIL ) 400 MG tablet Take 400 mg by mouth every 4 (four) hours as needed.     ondansetron  (ZOFRAN -ODT) 4 MG disintegrating tablet Take 4 mg by mouth every 8 (eight) hours as needed.     rosuvastatin  (CRESTOR ) 10 MG tablet Take 1 tablet (10 mg total) by mouth daily. 90 tablet 1   tirzepatide  (MOUNJARO ) 7.5 MG/0.5ML Pen Inject 7.5 mg into the skin once a week. 2 mL 0   Vitamin D , Ergocalciferol , (DRISDOL ) 1.25 MG (50000 UNIT) CAPS capsule Take 1 capsule (50,000 Units total) by mouth every 7 (seven) days. 20 capsule 1   No facility-administered medications prior to visit.    Allergies  Allergen Reactions   Tape    Wound Dressing Adhesive Hives    ROS Review of Systems  Constitutional:  Negative for chills and fever.  Eyes:  Negative for visual disturbance.  Respiratory:  Negative for chest tightness and shortness of breath.   Neurological:  Negative for dizziness and headaches.      Objective:  Physical Exam HENT:     Head: Normocephalic.     Mouth/Throat:     Mouth: Mucous membranes are moist.  Cardiovascular:     Rate and Rhythm: Normal rate.     Heart sounds: Normal heart sounds.  Pulmonary:     Effort: Pulmonary effort is normal.     Breath sounds: Normal breath sounds.  Neurological:     Mental Status: She is alert.     BP 122/70   Pulse 66   Ht 5' 2.5 (1.588 m)   Wt 231 lb 9.6 oz (105.1 kg)   SpO2 98%   BMI 41.69 kg/m  Wt Readings from Last 3 Encounters:  03/02/24 231 lb 9.6 oz (105.1 kg)  02/02/24 228 lb 6.4 oz (103.6 kg)  12/07/23 220 lb (99.8 kg)    Lab Results  Component Value Date   TSH 1.160 11/25/2023   Lab Results  Component Value Date   WBC 6.5 12/07/2023   HGB 13.1 12/07/2023   HCT 41.9 12/07/2023   MCV 73.3 (L) 12/07/2023   PLT 301 12/07/2023   Lab Results   Component Value Date   NA 134 (L) 12/07/2023   K 3.7 12/07/2023   CO2 23 12/07/2023   GLUCOSE 291 (H) 12/07/2023   BUN 9 12/07/2023   CREATININE 0.44 12/07/2023   BILITOT 0.2 11/25/2023   ALKPHOS 107 11/25/2023   AST 6 11/25/2023   ALT 13 11/25/2023   PROT 7.4 11/25/2023   ALBUMIN 4.4 11/25/2023   CALCIUM  9.2 12/07/2023   ANIONGAP 12 12/07/2023   EGFR 113 11/25/2023   Lab Results  Component Value Date   CHOL 221 (H) 11/25/2023   Lab Results  Component Value Date   HDL 43 11/25/2023   Lab Results  Component Value Date   LDLCALC 144 (H) 11/25/2023   Lab Results  Component Value Date   TRIG 188 (H) 11/25/2023   Lab Results  Component Value Date   CHOLHDL 5.1 (H) 11/25/2023   Lab Results  Component Value Date   HGBA1C 13.9 (H) 11/25/2023      Assessment & Plan:  Type 2 diabetes mellitus with hyperosmolarity without coma, without long-term current use of insulin (HCC) Assessment & Plan: The patient is currently taking Mounjaro  7.5 mg weekly and glimepiride  4 mg daily. She recently titrated to the 7.5 mg dose but reports that her glucose remains poorly controlled, with readings frequently in the 300s and occasionally as low as 150. She also notes that her appetite has not decreased, and she has been eating excessively. The patient is asymptomatic during today's visit but reports increased weight gain.  An A1c will be ordered to assess overall glycemic control and guide further adjustments to her treatment regimen. The patient declined insulin therapy at this time. Considering her concerns about weight gain and appetite, we will consider initiating oral phentermine to assist with appetite suppression and weight loss.  The patient was encouraged to reduce her intake of high-sugar foods and beverages and to increase physical activity as tolerated. She was also advised to seek emergency care if her blood sugar remains consistently above 250, especially if accompanied by  symptoms such as nausea, vomiting, abdominal pain, fruity-smelling breath, blurred vision, unintentional weight loss, or confusion.     Orders: -     Hemoglobin A1c -     Microalbumin / creatinine urine ratio  Morbid obesity (HCC) Assessment & Plan: Will consider initiating oral phentermine in one month if there are no significant  improvements in the patient's weight or appetite control on Mounjaro  7.5 mg. Reviewed the weight management plan with the patient and provided dietary counseling. The patient was encouraged to reduce portion sizes and limit intake of sugar, sodium, carbohydrates, and foods high in saturated and trans fats. She was also advised to increase her intake of fruits and vegetables and to engage in at least 30 minutes of moderate-intensity exercise, five days a week.     Hyperlipidemia associated with type 2 diabetes mellitus (HCC) Assessment & Plan: The patient was encouraged to continue the current treatment regimen as prescribed. She was advised to reduce her intake of greasy and fatty foods and to increase physical activity to support cardiovascular health.     Orders: -     Lipid panel -     CMP14+EGFR -     CBC with Differential/Platelet  Vitamin D  deficiency -     VITAMIN D  25 Hydroxy (Vit-D Deficiency, Fractures)  TSH (thyroid-stimulating hormone deficiency) -     TSH + free T4  Note: This chart has been completed using Engineer, civil (consulting) software, and while attempts have been made to ensure accuracy, certain words and phrases may not be transcribed as intended.    Follow-up: Return in about 3 months (around 06/02/2024).   Okla Qazi, FNP

## 2024-03-02 NOTE — Assessment & Plan Note (Signed)
 The patient was encouraged to continue the current treatment regimen as prescribed. She was advised to reduce her intake of greasy and fatty foods and to increase physical activity to support cardiovascular health.

## 2024-03-02 NOTE — Assessment & Plan Note (Signed)
 The patient is currently taking Mounjaro  7.5 mg weekly and glimepiride  4 mg daily. She recently titrated to the 7.5 mg dose but reports that her glucose remains poorly controlled, with readings frequently in the 300s and occasionally as low as 150. She also notes that her appetite has not decreased, and she has been eating excessively. The patient is asymptomatic during today's visit but reports increased weight gain.  An A1c will be ordered to assess overall glycemic control and guide further adjustments to her treatment regimen. The patient declined insulin therapy at this time. Considering her concerns about weight gain and appetite, we will consider initiating oral phentermine to assist with appetite suppression and weight loss.  The patient was encouraged to reduce her intake of high-sugar foods and beverages and to increase physical activity as tolerated. She was also advised to seek emergency care if her blood sugar remains consistently above 250, especially if accompanied by symptoms such as nausea, vomiting, abdominal pain, fruity-smelling breath, blurred vision, unintentional weight loss, or confusion.

## 2024-03-02 NOTE — Assessment & Plan Note (Signed)
 Will consider initiating oral phentermine in one month if there are no significant improvements in the patient's weight or appetite control on Mounjaro  7.5 mg. Reviewed the weight management plan with the patient and provided dietary counseling. The patient was encouraged to reduce portion sizes and limit intake of sugar, sodium, carbohydrates, and foods high in saturated and trans fats. She was also advised to increase her intake of fruits and vegetables and to engage in at least 30 minutes of moderate-intensity exercise, five days a week.

## 2024-03-02 NOTE — Patient Instructions (Signed)
 I appreciate the opportunity to provide care to you today!    Follow up:  3 months  Labs: please stop by the lab today to get your blood drawn (CBC, CMP, TSH, Lipid profile, HgA1c, Vit D)  For a Healthier YOU, I Recommend: Reducing your intake of sugar, sodium, carbohydrates, and saturated fats. Increasing your fiber intake by incorporating more whole grains, fruits, and vegetables into your meals. Setting healthy goals with a focus on lowering your consumption of carbs, sugar, and unhealthy fats. Adding variety to your diet by including a wide range of fruits and vegetables. Cutting back on soda and limiting processed foods as much as possible. Staying active: In addition to taking your weight loss medication, aim for at least 150 minutes of moderate-intensity physical activity each week for optimal results.    Please follow up if your symptoms worsen or fail to improve.    Attached with your AVS, you will find valuable resources for self-education. I highly recommend dedicating some time to thoroughly examine them.   Please continue to a heart-healthy diet and increase your physical activities. Try to exercise for at least five days a week.    It was a pleasure to see you and I look forward to continuing to work together on your health and well-being. Please do not hesitate to call the office if you need care or have questions about your care.  In case of emergency, please visit the Emergency Department for urgent care, or contact our clinic at [phone] to schedule an appointment. We're here to help you!   Have a wonderful day and week. With Gratitude, Abie Killian MSN, FNP-BC

## 2024-03-03 LAB — LIPID PANEL
Chol/HDL Ratio: 4.4 ratio (ref 0.0–4.4)
Cholesterol, Total: 150 mg/dL (ref 100–199)
HDL: 34 mg/dL — ABNORMAL LOW (ref >39)
LDL Chol Calc (NIH): 72 mg/dL (ref 0–99)
Triglycerides: 269 mg/dL — ABNORMAL HIGH (ref 0–149)
VLDL Cholesterol Cal: 44 mg/dL — ABNORMAL HIGH (ref 5–40)

## 2024-03-03 LAB — CMP14+EGFR
ALT: 16 IU/L (ref 0–32)
AST: 9 IU/L (ref 0–40)
Albumin: 4.2 g/dL (ref 3.9–4.9)
Alkaline Phosphatase: 94 IU/L (ref 44–121)
BUN/Creatinine Ratio: 31 — ABNORMAL HIGH (ref 9–23)
BUN: 16 mg/dL (ref 6–24)
Bilirubin Total: <0.2 mg/dL (ref 0.0–1.2)
CO2: 19 mmol/L — ABNORMAL LOW (ref 20–29)
Calcium: 9.4 mg/dL (ref 8.7–10.2)
Chloride: 101 mmol/L (ref 96–106)
Creatinine, Ser: 0.52 mg/dL — ABNORMAL LOW (ref 0.57–1.00)
Globulin, Total: 2.8 g/dL (ref 1.5–4.5)
Glucose: 211 mg/dL — ABNORMAL HIGH (ref 70–99)
Potassium: 4.4 mmol/L (ref 3.5–5.2)
Sodium: 137 mmol/L (ref 134–144)
Total Protein: 7 g/dL (ref 6.0–8.5)
eGFR: 119 mL/min/1.73 (ref >59)

## 2024-03-03 LAB — CBC WITH DIFFERENTIAL/PLATELET
Basophils Absolute: 0.1 x10E3/uL (ref 0.0–0.2)
Basos: 1 %
EOS (ABSOLUTE): 0.3 x10E3/uL (ref 0.0–0.4)
Eos: 3 %
Hematocrit: 43.1 % (ref 34.0–46.6)
Hemoglobin: 13 g/dL (ref 11.1–15.9)
Immature Grans (Abs): 0 x10E3/uL (ref 0.0–0.1)
Immature Granulocytes: 0 %
Lymphocytes Absolute: 1.8 x10E3/uL (ref 0.7–3.1)
Lymphs: 23 %
MCH: 23.8 pg — ABNORMAL LOW (ref 26.6–33.0)
MCHC: 30.2 g/dL — ABNORMAL LOW (ref 31.5–35.7)
MCV: 79 fL (ref 79–97)
Monocytes Absolute: 0.6 x10E3/uL (ref 0.1–0.9)
Monocytes: 8 %
Neutrophils Absolute: 5.1 x10E3/uL (ref 1.4–7.0)
Neutrophils: 65 %
Platelets: 321 x10E3/uL (ref 150–450)
RBC: 5.46 x10E6/uL — ABNORMAL HIGH (ref 3.77–5.28)
RDW: 13 % (ref 11.7–15.4)
WBC: 7.9 x10E3/uL (ref 3.4–10.8)

## 2024-03-03 LAB — HEMOGLOBIN A1C
Est. average glucose Bld gHb Est-mCnc: 200 mg/dL
Hgb A1c MFr Bld: 8.6 % — ABNORMAL HIGH (ref 4.8–5.6)

## 2024-03-03 LAB — TSH+FREE T4
Free T4: 1.16 ng/dL (ref 0.82–1.77)
TSH: 1.44 u[IU]/mL (ref 0.450–4.500)

## 2024-03-03 LAB — VITAMIN D 25 HYDROXY (VIT D DEFICIENCY, FRACTURES): Vit D, 25-Hydroxy: 28.2 ng/mL — ABNORMAL LOW (ref 30.0–100.0)

## 2024-03-04 ENCOUNTER — Ambulatory Visit: Payer: Self-pay | Admitting: Family Medicine

## 2024-03-22 ENCOUNTER — Ambulatory Visit: Payer: Self-pay | Admitting: Internal Medicine

## 2024-04-09 ENCOUNTER — Encounter: Payer: Self-pay | Admitting: Family Medicine

## 2024-04-09 ENCOUNTER — Other Ambulatory Visit: Payer: Self-pay | Admitting: Family Medicine

## 2024-04-09 DIAGNOSIS — E1100 Type 2 diabetes mellitus with hyperosmolarity without nonketotic hyperglycemic-hyperosmolar coma (NKHHC): Secondary | ICD-10-CM

## 2024-04-10 ENCOUNTER — Other Ambulatory Visit: Payer: Self-pay | Admitting: Family Medicine

## 2024-04-10 DIAGNOSIS — E1100 Type 2 diabetes mellitus with hyperosmolarity without nonketotic hyperglycemic-hyperosmolar coma (NKHHC): Secondary | ICD-10-CM

## 2024-04-10 MED ORDER — TIRZEPATIDE 10 MG/0.5ML ~~LOC~~ SOAJ: 10.0000 mg | SUBCUTANEOUS | 0 refills | Status: DC

## 2024-04-11 ENCOUNTER — Telehealth: Payer: Self-pay | Admitting: Family Medicine

## 2024-04-11 NOTE — Telephone Encounter (Signed)
 Copied from CRM #[id number]. Topic: General - Phone/Fax/Address >> Apr 11, 2024  9:40 AM Antwanette L wrote: Lincoln from [facility] is calling confirm office fax number and to see if the patient still see Gloria Zarwolo. Lincoln will be sending over a medical clearance form >> Apr 11, 2024 10:02 AM Donna BRAVO wrote: Amari from [facility] is calling  back to request patient previous A1c  it is under control per her last office visit  03/02/24  Lincoln would like to obtain information now and speak with a nurse

## 2024-04-11 NOTE — Telephone Encounter (Unsigned)
 Copied from CRM #[id number]. Topic: General - Phone/Fax/Address >> Apr 11, 2024 10:02 AM Donna BRAVO wrote: Amari from [facility] is calling  back to request patient previous A1c  it is under control per her last office visit  03/02/24  Lincoln would like to obtain information now and speak with a nurse

## 2024-04-11 NOTE — Telephone Encounter (Signed)
 This RN relayed most recent A1C to oral sx office.

## 2024-04-18 ENCOUNTER — Other Ambulatory Visit: Payer: Self-pay | Admitting: Family Medicine

## 2024-04-18 DIAGNOSIS — E1100 Type 2 diabetes mellitus with hyperosmolarity without nonketotic hyperglycemic-hyperosmolar coma (NKHHC): Secondary | ICD-10-CM

## 2024-05-07 ENCOUNTER — Other Ambulatory Visit: Payer: Self-pay | Admitting: Family Medicine

## 2024-05-07 DIAGNOSIS — E1100 Type 2 diabetes mellitus with hyperosmolarity without nonketotic hyperglycemic-hyperosmolar coma (NKHHC): Secondary | ICD-10-CM

## 2024-05-10 ENCOUNTER — Other Ambulatory Visit: Payer: Self-pay | Admitting: Family Medicine

## 2024-05-10 ENCOUNTER — Telehealth: Payer: Self-pay

## 2024-05-10 DIAGNOSIS — E1100 Type 2 diabetes mellitus with hyperosmolarity without nonketotic hyperglycemic-hyperosmolar coma (NKHHC): Secondary | ICD-10-CM

## 2024-05-10 MED ORDER — TIRZEPATIDE 12.5 MG/0.5ML ~~LOC~~ SOAJ: 12.5000 mg | SUBCUTANEOUS | 0 refills | Status: DC

## 2024-05-10 NOTE — Telephone Encounter (Signed)
 This was sent.

## 2024-05-10 NOTE — Telephone Encounter (Signed)
 Copied from CRM [id number]. Topic: Clinical - Prescription Issue >> May 10, 2024 11:37 AM Berwyn MATSU wrote: Reason for CRM: patient called in to see what is going on with refill request. Per patient Pharmacy and refill request has been sent for over a week and there is still no update. Per patient she is now a week behind on her Mounjaro  medication because no refill has been sent over. Pharmacy has called and sent request in and it is still pending.  May you please assist.

## 2024-05-30 NOTE — Progress Notes (Signed)
 Alexandra Ortiz                                          MRN: [id number]   05/30/2024   The [facility] Quality Team Specialist reviewed this patient medical record for the purposes of chart review for care gap closure. The following were reviewed: chart review for care gap closure-glycemic status assessment.    [facility] Quality Team

## 2024-06-08 ENCOUNTER — Encounter: Payer: Self-pay | Admitting: Family Medicine

## 2024-06-08 ENCOUNTER — Ambulatory Visit: Admitting: Family Medicine

## 2024-06-08 DIAGNOSIS — E1100 Type 2 diabetes mellitus with hyperosmolarity without nonketotic hyperglycemic-hyperosmolar coma (NKHHC): Secondary | ICD-10-CM | POA: Diagnosis not present

## 2024-06-08 DIAGNOSIS — E1169 Type 2 diabetes mellitus with other specified complication: Secondary | ICD-10-CM

## 2024-06-08 DIAGNOSIS — Z7985 Long-term (current) use of injectable non-insulin antidiabetic drugs: Secondary | ICD-10-CM

## 2024-06-08 DIAGNOSIS — E7849 Other hyperlipidemia: Secondary | ICD-10-CM

## 2024-06-08 DIAGNOSIS — R110 Nausea: Secondary | ICD-10-CM

## 2024-06-08 DIAGNOSIS — E559 Vitamin D deficiency, unspecified: Secondary | ICD-10-CM

## 2024-06-08 DIAGNOSIS — E785 Hyperlipidemia, unspecified: Secondary | ICD-10-CM

## 2024-06-08 DIAGNOSIS — I270 Primary pulmonary hypertension: Secondary | ICD-10-CM

## 2024-06-08 MED ORDER — TIRZEPATIDE 15 MG/0.5ML ~~LOC~~ SOAJ: 15.0000 mg | SUBCUTANEOUS | 0 refills | Status: DC

## 2024-06-08 MED ORDER — PHENTERMINE HCL 15 MG PO CAPS: 15.0000 mg | ORAL_CAPSULE | ORAL | 0 refills | Status: AC

## 2024-06-08 MED ORDER — METOCLOPRAMIDE HCL 5 MG PO TABS: 5.0000 mg | ORAL_TABLET | Freq: Four times a day (QID) | ORAL | 0 refills | Status: DC | PRN

## 2024-06-08 NOTE — Assessment & Plan Note (Signed)
 Controlled Low sodium diet with increased physical activity

## 2024-06-08 NOTE — Patient Instructions (Addendum)
 I appreciate the opportunity to provide care to you today!    Follow up:  4 months  Labs: please stop by the lab today to get your blood drawn (CBC, CMP, TSH, Lipid profile, HgA1c, Vit D)  Start taking phentermine 15mg  daily. For optimal results with weight loss, I recommend:  Decreasing portion sizes. Reducing sugar, sodium, and carbohydrate intake, and limiting saturated fats in your diet. Increasing your fiber intake by incorporating more whole grains, fruits, and vegetables. Setting healthy goals and focusing on lowering carbs, sugar, and fat. Increasing the variety of fruits and vegetables in your diet. Reducing soda consumption and limiting processed foods. In addition to taking your weight loss medication, engage in moderate-intensity physical activity for at least 150 minutes per week for the best results.   Nausea Start taking Reglan 5 mg by mouth every 6 hours as needed   Please follow up if your symptoms worsen or fail to improve.    Please continue to a heart-healthy diet and increase your physical activities. Try to exercise for at least five days a week.    It was a pleasure to see you and I look forward to continuing to work together on your health and well-being. Please do not hesitate to call the office if you need care or have questions about your care.  In case of emergency, please visit the Emergency Department for urgent care, or contact our clinic at [phone] to schedule an appointment. We're here to help you!   Have a wonderful day and week. With Gratitude, Meade JENEANE Gerlach MSN, FNP-BC, PMHNP-BC

## 2024-06-08 NOTE — Assessment & Plan Note (Signed)
 The patient is currently taking Mounjaro  12.5 mg weekly and glimepiride  4 mg daily. Will increase the dose of Mounjaro  to 15 mg weekly. .  The patient was encouraged to reduce her intake of high-sugar foods and beverages and to increase physical activity as tolerated. She was also advised to seek emergency care if her blood sugar remains consistently above 250, especially if accompanied by symptoms such as nausea, vomiting, abdominal pain, fruity-smelling breath, blurred vision, unintentional weight loss, or confusion. Lab Results  Component Value Date   HGBA1C 8.6 (H) 03/02/2024   HGBA1C 13.9 (H) 11/25/2023   HGBA1C 13.7 (H) 01/14/2023

## 2024-06-08 NOTE — Assessment & Plan Note (Addendum)
 Start taking phentermine 15 mg daily. For optimal results with weight loss, I recommend:  Decreasing portion sizes. Reducing sugar, sodium, and carbohydrate intake, and limiting saturated fats in your diet. Increasing your fiber intake by incorporating more whole grains, fruits, and vegetables. Setting healthy goals and focusing on lowering carbs, sugar, and fat. Increasing the variety of fruits and vegetables in your diet. Reducing soda consumption and limiting processed foods. In addition to taking your weight loss medication, engage in moderate-intensity physical activity for at least 150 minutes per week for the best results.   Wt Readings from Last 3 Encounters:  06/08/24 233 lb 1.3 oz (105.7 kg)  03/02/24 231 lb 9.6 oz (105.1 kg)  02/02/24 228 lb 6.4 oz (103.6 kg)

## 2024-06-08 NOTE — Assessment & Plan Note (Signed)
 Minimal relief with Zofran . Will start the patient on Reglan for better control of her nausea.

## 2024-06-08 NOTE — Assessment & Plan Note (Signed)
 The patient was encouraged to continue the current treatment regimen as prescribed ( rosuvastatin  10 mg daily). She was advised to reduce her intake of greasy and fatty foods and to increase physical activity to support cardiovascular health.

## 2024-06-08 NOTE — Progress Notes (Signed)
 Established Patient Office Visit  Subjective:  Patient ID: Alexandra Ortiz, female    DOB: [birth year]  Age: 43 y.o. MRN: [id number]  CC:  Chief Complaint  Patient presents with   Diabetes    Follow up. Pt wanting to discuss getting on phentermine      HPI Alexandra Ortiz is a 43 y.o. female with past medical history of type 2 diabetes, hyperlipidemia, obesity presents for f/u of  chronic medical conditions.  For the details of today's visit, please refer to the assessment and plan.      Past Medical History:  Diagnosis Date   Diabetes mellitus without complication (HCC)     History reviewed. No pertinent surgical history.  Family History  Problem Relation Age of Onset   Diabetes Mother    Diabetes Father    Hypertension Father    Heart failure Father     Social History   Socioeconomic History   Marital status: Married    Spouse name: Not on file   Number of children: Not on file   Years of education: Not on file   Highest education level: Not on file  Occupational History   Not on file  Tobacco Use   Smoking status: Never    Passive exposure: Past   Smokeless tobacco: Never  Vaping Use   Vaping status: Never Used  Substance and Sexual Activity   Alcohol use: Not Currently   Drug use: Yes    Types: Marijuana   Sexual activity: Yes  Other Topics Concern   Not on file  Social History Narrative   ** Merged History Encounter **       Social Drivers of Corporate investment banker Strain: Not on file  Food Insecurity: Not on file  Transportation Needs: Not on file  Physical Activity: Not on file  Stress: Not on file  Social Connections: Not on file  Intimate Partner Violence: Not on file    Outpatient Medications Prior to Visit  Medication Sig Dispense Refill   clonazePAM (KLONOPIN) 0.5 MG tablet Take 0.5 mg by mouth 2 (two) times daily as needed for anxiety.     Continuous Glucose Sensor (FREESTYLE LIBRE 3 SENSOR) MISC PLACE 1 SENSOR ON THE SKIN  EVERY 14 DAYS AS DIRECTED. USE TO CHECK GLUCOSE CONTINUOUSLY 2 each 4   glimepiride  (AMARYL ) 4 MG tablet Take 1 tablet (4 mg total) by mouth daily before breakfast. 60 tablet 3   ibuprofen  (ADVIL ) 400 MG tablet Take 400 mg by mouth every 4 (four) hours as needed.     ondansetron  (ZOFRAN -ODT) 4 MG disintegrating tablet Take 4 mg by mouth every 8 (eight) hours as needed.     rosuvastatin  (CRESTOR ) 10 MG tablet Take 1 tablet (10 mg total) by mouth daily. 90 tablet 1   Vitamin D , Ergocalciferol , (DRISDOL ) 1.25 MG (50000 UNIT) CAPS capsule Take 1 capsule (50,000 Units total) by mouth every 7 (seven) days. 20 capsule 1   tirzepatide  (MOUNJARO ) 12.5 MG/0.5ML Pen Inject 12.5 mg into the skin once a week. 2 mL 0   No facility-administered medications prior to visit.    Allergies  Allergen Reactions   Tape    Wound Dressing Adhesive Hives    ROS Review of Systems  Constitutional:  Negative for chills and fever.  Eyes:  Negative for visual disturbance.  Respiratory:  Negative for chest tightness and shortness of breath.   Neurological:  Negative for dizziness and headaches.      Objective:  Physical Exam Constitutional:      Appearance: She is obese.  HENT:     Head: Normocephalic.     Mouth/Throat:     Mouth: Mucous membranes are moist.  Cardiovascular:     Rate and Rhythm: Normal rate.     Heart sounds: Normal heart sounds.  Pulmonary:     Effort: Pulmonary effort is normal.     Breath sounds: Normal breath sounds.  Neurological:     Mental Status: She is alert.     BP 136/80   Pulse 98   Resp 18   Ht 5' 3 (1.6 m)   Wt 233 lb 1.3 oz (105.7 kg)   SpO2 97%   BMI 41.29 kg/m  Wt Readings from Last 3 Encounters:  06/08/24 233 lb 1.3 oz (105.7 kg)  03/02/24 231 lb 9.6 oz (105.1 kg)  02/02/24 228 lb 6.4 oz (103.6 kg)    Lab Results  Component Value Date   TSH 1.440 03/02/2024   Lab Results  Component Value Date   WBC 7.9 03/02/2024   HGB 13.0 03/02/2024   HCT  43.1 03/02/2024   MCV 79 03/02/2024   PLT 321 03/02/2024   Lab Results  Component Value Date   NA 137 03/02/2024   K 4.4 03/02/2024   CO2 19 (L) 03/02/2024   GLUCOSE 211 (H) 03/02/2024   BUN 16 03/02/2024   CREATININE 0.52 (L) 03/02/2024   BILITOT <0.2 03/02/2024   ALKPHOS 94 03/02/2024   AST 9 03/02/2024   ALT 16 03/02/2024   PROT 7.0 03/02/2024   ALBUMIN 4.2 03/02/2024   CALCIUM  9.4 03/02/2024   ANIONGAP 12 12/07/2023   EGFR 119 03/02/2024   Lab Results  Component Value Date   CHOL 150 03/02/2024   Lab Results  Component Value Date   HDL 34 (L) 03/02/2024   Lab Results  Component Value Date   LDLCALC 72 03/02/2024   Lab Results  Component Value Date   TRIG 269 (H) 03/02/2024   Lab Results  Component Value Date   CHOLHDL 4.4 03/02/2024   Lab Results  Component Value Date   HGBA1C 8.6 (H) 03/02/2024      Assessment & Plan:  Morbid obesity (HCC) Assessment & Plan: Start taking phentermine 15 mg daily. For optimal results with weight loss, I recommend:  Decreasing portion sizes. Reducing sugar, sodium, and carbohydrate intake, and limiting saturated fats in your diet. Increasing your fiber intake by incorporating more whole grains, fruits, and vegetables. Setting healthy goals and focusing on lowering carbs, sugar, and fat. Increasing the variety of fruits and vegetables in your diet. Reducing soda consumption and limiting processed foods. In addition to taking your weight loss medication, engage in moderate-intensity physical activity for at least 150 minutes per week for the best results.   Wt Readings from Last 3 Encounters:  06/08/24 233 lb 1.3 oz (105.7 kg)  03/02/24 231 lb 9.6 oz (105.1 kg)  02/02/24 228 lb 6.4 oz (103.6 kg)     Orders: -     Phentermine HCl; Take 1 capsule (15 mg total) by mouth every morning.  Dispense: 30 capsule; Refill: 0  Type 2 diabetes mellitus with hyperosmolarity without coma, without long-term current use of  insulin (HCC) Assessment & Plan: The patient is currently taking Mounjaro  12.5 mg weekly and glimepiride  4 mg daily. Will increase the dose of Mounjaro  to 15 mg weekly. .  The patient was encouraged to reduce her intake of high-sugar foods and beverages and  to increase physical activity as tolerated. She was also advised to seek emergency care if her blood sugar remains consistently above 250, especially if accompanied by symptoms such as nausea, vomiting, abdominal pain, fruity-smelling breath, blurred vision, unintentional weight loss, or confusion. Lab Results  Component Value Date   HGBA1C 8.6 (H) 03/02/2024   HGBA1C 13.9 (H) 11/25/2023   HGBA1C 13.7 (H) 01/14/2023        Orders: -     Tirzepatide ; Inject 15 mg into the skin once a week.  Dispense: 6 mL; Refill: 0 -     Microalbumin / creatinine urine ratio -     Hemoglobin A1c  Hyperlipidemia associated with type 2 diabetes mellitus (HCC) Assessment & Plan: The patient was encouraged to continue the current treatment regimen as prescribed ( rosuvastatin  10 mg daily). She was advised to reduce her intake of greasy and fatty foods and to increase physical activity to support cardiovascular health.      Nausea Assessment & Plan: Minimal relief with Zofran . Will start the patient on Reglan for better control of her nausea.   Orders: -     Metoclopramide HCl; Take 1 tablet (5 mg total) by mouth every 6 (six) hours as needed for nausea.  Dispense: 30 tablet; Refill: 0  Vitamin D  deficiency -     VITAMIN D  25 Hydroxy (Vit-D Deficiency, Fractures)  Other hyperlipidemia -     Lipid panel -     CMP14+EGFR -     CBC with Differential/Platelet  Pulmonary hypertension, primary (HCC) Assessment & Plan: Controlled Low sodium diet with increased physical activity    Note: This chart has been completed using Engineer, civil (consulting) software, and while attempts have been made to ensure accuracy, certain words and phrases may  not be transcribed as intended.    Follow-up: Return in about 4 months (around 10/09/2024).   Alexandra Ortiz  Z Bacchus, FNP

## 2024-06-09 LAB — CBC WITH DIFFERENTIAL/PLATELET
Basophils Absolute: 0.1 x10E3/uL (ref 0.0–0.2)
Basos: 1 %
EOS (ABSOLUTE): 0.2 x10E3/uL (ref 0.0–0.4)
Eos: 3 %
Hematocrit: 39.7 % (ref 34.0–46.6)
Hemoglobin: 12 g/dL (ref 11.1–15.9)
Immature Grans (Abs): 0 x10E3/uL (ref 0.0–0.1)
Immature Granulocytes: 0 %
Lymphocytes Absolute: 2 x10E3/uL (ref 0.7–3.1)
Lymphs: 24 %
MCH: 23.7 pg — ABNORMAL LOW (ref 26.6–33.0)
MCHC: 30.2 g/dL — ABNORMAL LOW (ref 31.5–35.7)
MCV: 79 fL (ref 79–97)
Monocytes Absolute: 0.6 x10E3/uL (ref 0.1–0.9)
Monocytes: 7 %
Neutrophils Absolute: 5.4 x10E3/uL (ref 1.4–7.0)
Neutrophils: 65 %
Platelets: 312 x10E3/uL (ref 150–450)
RBC: 5.06 x10E6/uL (ref 3.77–5.28)
RDW: 13.5 % (ref 11.7–15.4)
WBC: 8.3 x10E3/uL (ref 3.4–10.8)

## 2024-06-09 LAB — HEMOGLOBIN A1C
Est. average glucose Bld gHb Est-mCnc: 189 mg/dL
Hgb A1c MFr Bld: 8.2 % — ABNORMAL HIGH (ref 4.8–5.6)

## 2024-06-09 LAB — CMP14+EGFR
ALT: 16 IU/L (ref 0–32)
AST: 11 IU/L (ref 0–40)
Albumin: 4 g/dL (ref 3.9–4.9)
Alkaline Phosphatase: 84 IU/L (ref 41–116)
BUN/Creatinine Ratio: 18 (ref 9–23)
BUN: 12 mg/dL (ref 6–24)
Bilirubin Total: <0.2 mg/dL (ref 0.0–1.2)
CO2: 22 mmol/L (ref 20–29)
Calcium: 9.1 mg/dL (ref 8.7–10.2)
Chloride: 100 mmol/L (ref 96–106)
Creatinine, Ser: 0.65 mg/dL (ref 0.57–1.00)
Globulin, Total: 2.8 g/dL (ref 1.5–4.5)
Glucose: 223 mg/dL — ABNORMAL HIGH (ref 70–99)
Potassium: 4.4 mmol/L (ref 3.5–5.2)
Sodium: 133 mmol/L — ABNORMAL LOW (ref 134–144)
Total Protein: 6.8 g/dL (ref 6.0–8.5)
eGFR: 112 mL/min/1.73 (ref >59)

## 2024-06-09 LAB — LIPID PANEL
Chol/HDL Ratio: 6 ratio — ABNORMAL HIGH (ref 0.0–4.4)
Cholesterol, Total: 179 mg/dL (ref 100–199)
HDL: 30 mg/dL — ABNORMAL LOW (ref >39)
LDL Chol Calc (NIH): 114 mg/dL — ABNORMAL HIGH (ref 0–99)
Triglycerides: 198 mg/dL — ABNORMAL HIGH (ref 0–149)
VLDL Cholesterol Cal: 35 mg/dL (ref 5–40)

## 2024-06-09 LAB — VITAMIN D 25 HYDROXY (VIT D DEFICIENCY, FRACTURES): Vit D, 25-Hydroxy: 27.3 ng/mL — ABNORMAL LOW (ref 30.0–100.0)

## 2024-06-11 ENCOUNTER — Ambulatory Visit: Payer: Self-pay | Admitting: Family Medicine

## 2024-06-11 DIAGNOSIS — E1169 Type 2 diabetes mellitus with other specified complication: Secondary | ICD-10-CM

## 2024-06-11 DIAGNOSIS — E559 Vitamin D deficiency, unspecified: Secondary | ICD-10-CM

## 2024-06-11 MED ORDER — ROSUVASTATIN CALCIUM 20 MG PO TABS: 20.0000 mg | ORAL_TABLET | Freq: Every day | ORAL | 3 refills | Status: AC

## 2024-06-11 MED ORDER — VITAMIN D (ERGOCALCIFEROL) 1.25 MG (50000 UNIT) PO CAPS: 50000.0000 [IU] | ORAL_CAPSULE | ORAL | 1 refills | Status: AC

## 2024-06-29 ENCOUNTER — Ambulatory Visit
Admission: EM | Admit: 2024-06-29 | Discharge: 2024-06-29 | Disposition: A | Discharge status: Home / Self Care | Attending: Nurse Practitioner | Admitting: Nurse Practitioner

## 2024-06-29 DIAGNOSIS — J0100 Acute maxillary sinusitis, unspecified: Secondary | ICD-10-CM | POA: Diagnosis not present

## 2024-06-29 MED ORDER — FLUTICASONE PROPIONATE 50 MCG/ACT NA SUSP: 2.0000 | Freq: Every day | NASAL | 0 refills | Status: AC

## 2024-06-29 MED ORDER — AMOXICILLIN-POT CLAVULANATE 875-125 MG PO TABS: 1.0000 | ORAL_TABLET | Freq: Two times a day (BID) | ORAL | 0 refills | Status: DC

## 2024-06-29 MED ORDER — PROMETHAZINE-DM 6.25-15 MG/5ML PO SYRP: 5.0000 mL | ORAL_SOLUTION | Freq: Four times a day (QID) | ORAL | 0 refills | Status: DC | PRN

## 2024-06-29 NOTE — ED Triage Notes (Signed)
 Pt reports she has a cough, chest pain, headache, sinus pressure, and unable to sleep x 1 week.

## 2024-06-29 NOTE — ED Provider Notes (Signed)
 [facility]    CSN: [id number] Arrival date & time: 06/29/24  1843      History   Chief Complaint Chief Complaint  Patient presents with   Cough    HPI Alexandra Ortiz is a 43 y.o. female.   The history is provided by the patient.   Patient presents for complaints of cough, chest congestion, headache, sinus pressure, that is been present for the past week.  Patient reports that I have so much drainage I am unable to sleep.  States symptoms started with a sore throat, but that has since improved.  She states she has been taking over-the-counter medications for her symptoms with minimal relief.  States that her son has been sick with the same or similar symptoms.  Past Medical History:  Diagnosis Date   Diabetes mellitus without complication [facility])     Patient Active Problem List   Diagnosis Date Noted   Morbid obesity (HCC) 03/02/2024   Family history of pulmonary hypertension 02/02/2024   Hyperlipidemia associated with type 2 diabetes mellitus (HCC) 11/25/2023   Pulmonary hypertension, primary (HCC) 11/25/2023   Nausea 04/22/2023   Type 2 diabetes mellitus with hyperosmolarity without coma, without long-term current use of insulin (HCC) 11/08/2022   Anxiety and depression 11/08/2022    History reviewed. No pertinent surgical history.  OB History   No obstetric history on file.      Home Medications    Prior to Admission medications   Medication Sig Start Date End Date Taking? Authorizing Provider  clonazePAM (KLONOPIN) 0.5 MG tablet Take 0.5 mg by mouth 2 (two) times daily as needed for anxiety.    [provider]  Continuous Glucose Sensor (FREESTYLE LIBRE 3 SENSOR) MISC PLACE 1 SENSOR ON THE SKIN EVERY 14 DAYS AS DIRECTED. USE TO CHECK GLUCOSE CONTINUOUSLY 04/18/24   Bacchus, Gloria Z, FNP  glimepiride  (AMARYL ) 4 MG tablet Take 1 tablet (4 mg total) by mouth daily before breakfast. 02/23/24   Bacchus, Meade PEDLAR, FNP  ibuprofen  (ADVIL ) 400 MG  tablet Take 400 mg by mouth every 4 (four) hours as needed. 11/29/23   [provider]  metoCLOPramide (REGLAN) 5 MG tablet Take 1 tablet (5 mg total) by mouth every 6 (six) hours as needed for nausea. 06/08/24   Bacchus, Meade PEDLAR, FNP  ondansetron  (ZOFRAN -ODT) 4 MG disintegrating tablet Take 4 mg by mouth every 8 (eight) hours as needed. 11/29/23   [provider]  phentermine 15 MG capsule Take 1 capsule (15 mg total) by mouth every morning. 06/08/24   Bacchus, Meade PEDLAR, FNP  rosuvastatin  (CRESTOR ) 20 MG tablet Take 1 tablet (20 mg total) by mouth daily. 06/11/24   Bacchus, Meade PEDLAR, FNP  tirzepatide  (MOUNJARO ) 15 MG/0.5ML Pen Inject 15 mg into the skin once a week. 06/08/24   Bacchus, Gloria Z, FNP  Vitamin D , Ergocalciferol , (DRISDOL ) 1.25 MG (50000 UNIT) CAPS capsule Take 1 capsule (50,000 Units total) by mouth every 7 (seven) days. 06/11/24   Bacchus, Meade PEDLAR, FNP    Family History Family History  Problem Relation Age of Onset   Diabetes Mother    Diabetes Father    Hypertension Father    Heart failure Father     Social History Social History   Tobacco Use   Smoking status: Never    Passive exposure: Past   Smokeless tobacco: Never  Vaping Use   Vaping status: Never Used  Substance Use Topics   Alcohol use: Not Currently   Drug use:  Yes    Types: Marijuana     Allergies   Tape and Wound dressing adhesive   Review of Systems Review of Systems Per HPI  Physical Exam Triage Vital Signs ED Triage Vitals  Encounter Vitals Group     BP 06/29/24 1926 126/83     Girls Systolic BP Percentile --      Girls Diastolic BP Percentile --      Boys Systolic BP Percentile --      Boys Diastolic BP Percentile --      Pulse Rate 06/29/24 1926 98     Resp 06/29/24 1926 16     Temp 06/29/24 1926 98.8 F (37.1 C)     Temp src --      SpO2 06/29/24 1926 96 %     Weight --      Height --      Head Circumference --      Peak Flow --      Pain Score 06/29/24  1928 0     Pain Loc --      Pain Education --      Exclude from Growth Chart --    No data found.  Updated Vital Signs BP 126/83 (BP Location: Right Arm)   Pulse 98   Temp 98.8 F (37.1 C)   Resp 16   LMP 06/13/2024   SpO2 96%   Visual Acuity Right Eye Distance:   Left Eye Distance:   Bilateral Distance:    Right Eye Near:   Left Eye Near:    Bilateral Near:     Physical Exam Vitals and nursing note reviewed.  Constitutional:      General: She is not in acute distress.    Appearance: Normal appearance. She is well-developed.  HENT:     Head: Normocephalic and atraumatic.     Right Ear: Tympanic membrane, ear canal and external ear normal.     Left Ear: Tympanic membrane, ear canal and external ear normal.     Nose: Congestion present.     Right Turbinates: Enlarged and swollen.     Left Turbinates: Enlarged and swollen.     Right Sinus: Maxillary sinus tenderness present. No frontal sinus tenderness.     Left Sinus: Maxillary sinus tenderness present. No frontal sinus tenderness.     Mouth/Throat:     Lips: Pink.     Mouth: Mucous membranes are moist.     Pharynx: Uvula midline. Posterior oropharyngeal erythema and postnasal drip present. No pharyngeal swelling, oropharyngeal exudate or uvula swelling.     Comments: Cobblestoning present to posterior oropharynx  Eyes:     Extraocular Movements: Extraocular movements intact.     Conjunctiva/sclera: Conjunctivae normal.     Pupils: Pupils are equal, round, and reactive to light.  Neck:     Thyroid: No thyromegaly.     Trachea: No tracheal deviation.  Cardiovascular:     Rate and Rhythm: Normal rate and regular rhythm.     Pulses: Normal pulses.     Heart sounds: Normal heart sounds.  Pulmonary:     Effort: Pulmonary effort is normal. No respiratory distress.     Breath sounds: Normal breath sounds. No stridor. No wheezing, rhonchi or rales.  Abdominal:     General: Bowel sounds are normal.     Palpations:  Abdomen is soft.     Tenderness: There is no abdominal tenderness.  Musculoskeletal:     Cervical back: Normal range of motion and neck supple.  Skin:    General: Skin is warm and dry.  Neurological:     General: No focal deficit present.     Mental Status: She is alert and oriented to person, place, and time.  Psychiatric:        Mood and Affect: Mood normal.        Behavior: Behavior normal.        Thought Content: Thought content normal.        Judgment: Judgment normal.      UC Treatments / Results  Labs (all labs ordered are listed, but only abnormal results are displayed) Labs Reviewed - No data to display  EKG   Radiology No results found.  Procedures Procedures (including critical care time)  Medications Ordered in UC Medications - No data to display  Initial Impression / Assessment and Plan / UC Course  I have reviewed the triage vital signs and the nursing notes.  Pertinent labs & imaging results that were available during my care of the patient were reviewed by me and considered in my medical decision making (see chart for details).  Patient presents for complaints of upper respiratory symptoms that been present for 1 week.  On exam, she does have maxillary sinus tenderness, lung sounds are clear throughout, room air sats are at 96%.  Patient has been taking over-the-counter medications with minimal relief of her symptoms.  Concern for bacterial etiology at this time.  Will treat for acute maxillary sinusitis with Augmentin  875/125 mg, symptomatic treatment provided with Promethazine DM for the cough and fluticasone 50 mcg nasal spray.  Supportive care recommendations were provided discussed with the patient to include fluids, rest, over-the-counter analgesics, and use of a humidifier during sleep.  Discussed indications with the patient regarding follow-up.  Patient was in agreement with this plan of care and verbalizes understanding.  All questions were answered.   Patient stable for discharge.  Final Clinical Impressions(s) / UC Diagnoses   Final diagnoses:  None   Discharge Instructions   None    ED Prescriptions   None    PDMP not reviewed this encounter.   Gilmer Etta PARAS, NP 06/29/24 2001

## 2024-06-29 NOTE — Discharge Instructions (Signed)
 Take medication as directed. Increase fluids and get plenty of rest. May take over-the-counter ibuprofen  or Tylenol  as needed for pain, fever, or general discomfort. Recommend normal saline nasal spray to help with nasal congestion throughout the day. For your cough, it may be helpful to use a humidifier at bedtime during sleep. If your symptoms fail to improve with this treatment, recommend follow-up with your primary care physician for further evaluation. Follow-up as needed.

## 2024-08-23 ENCOUNTER — Telehealth: Payer: Self-pay

## 2024-08-23 NOTE — Telephone Encounter (Signed)
 Copied from CRM [id number]. Topic: General - Other >> Aug 22, 2024  1:49 PM Shardie S wrote: Reason for CRM: Patient requesting a copy of medical necessity letter for window tint.

## 2024-08-24 ENCOUNTER — Other Ambulatory Visit: Payer: Self-pay

## 2024-08-24 ENCOUNTER — Encounter (HOSPITAL_COMMUNITY): Payer: Self-pay | Admitting: *Deleted

## 2024-08-24 ENCOUNTER — Other Ambulatory Visit: Payer: Self-pay | Admitting: Family Medicine

## 2024-08-24 ENCOUNTER — Emergency Department (HOSPITAL_COMMUNITY)
Admission: EM | Admit: 2024-08-24 | Discharge: 2024-08-24 | Disposition: A | Discharge status: Home / Self Care | Source: Home / Self Care | Attending: Emergency Medicine | Admitting: Emergency Medicine

## 2024-08-24 ENCOUNTER — Emergency Department (HOSPITAL_COMMUNITY)

## 2024-08-24 DIAGNOSIS — R112 Nausea with vomiting, unspecified: Secondary | ICD-10-CM | POA: Diagnosis present

## 2024-08-24 DIAGNOSIS — Z794 Long term (current) use of insulin: Secondary | ICD-10-CM | POA: Diagnosis not present

## 2024-08-24 DIAGNOSIS — R1031 Right lower quadrant pain: Secondary | ICD-10-CM | POA: Insufficient documentation

## 2024-08-24 DIAGNOSIS — R197 Diarrhea, unspecified: Secondary | ICD-10-CM | POA: Insufficient documentation

## 2024-08-24 DIAGNOSIS — E1100 Type 2 diabetes mellitus with hyperosmolarity without nonketotic hyperglycemic-hyperosmolar coma (NKHHC): Secondary | ICD-10-CM

## 2024-08-24 DIAGNOSIS — E119 Type 2 diabetes mellitus without complications: Secondary | ICD-10-CM | POA: Insufficient documentation

## 2024-08-24 LAB — COMPREHENSIVE METABOLIC PANEL WITH GFR
ALT: 11 U/L (ref 0–44)
AST: 11 U/L — ABNORMAL LOW (ref 15–41)
Albumin: 4.5 g/dL (ref 3.5–5.0)
Alkaline Phosphatase: 84 U/L (ref 38–126)
Anion gap: 17 — ABNORMAL HIGH (ref 5–15)
BUN: 9 mg/dL (ref 6–20)
CO2: 19 mmol/L — ABNORMAL LOW (ref 22–32)
Calcium: 9.4 mg/dL (ref 8.9–10.3)
Chloride: 101 mmol/L (ref 98–111)
Creatinine, Ser: 0.56 mg/dL (ref 0.44–1.00)
GFR, Estimated: >60 mL/min
Glucose, Bld: 139 mg/dL — ABNORMAL HIGH (ref 70–99)
Potassium: 3.5 mmol/L (ref 3.5–5.1)
Sodium: 137 mmol/L (ref 135–145)
Total Bilirubin: 0.3 mg/dL (ref 0.0–1.2)
Total Protein: 8.2 g/dL — ABNORMAL HIGH (ref 6.5–8.1)

## 2024-08-24 LAB — CBC
HCT: 43.5 % (ref 36.0–46.0)
Hemoglobin: 13.3 g/dL (ref 12.0–15.0)
MCH: 23.3 pg — ABNORMAL LOW (ref 26.0–34.0)
MCHC: 30.6 g/dL (ref 30.0–36.0)
MCV: 76.2 fL — ABNORMAL LOW (ref 80.0–100.0)
Platelets: 349 K/uL (ref 150–400)
RBC: 5.71 MIL/uL — ABNORMAL HIGH (ref 3.87–5.11)
RDW: 14.1 % (ref 11.5–15.5)
WBC: 10.9 K/uL — ABNORMAL HIGH (ref 4.0–10.5)
nRBC: 0 % (ref 0.0–0.2)

## 2024-08-24 LAB — URINALYSIS, ROUTINE W REFLEX MICROSCOPIC
Bilirubin Urine: NEGATIVE
Glucose, UA: NEGATIVE mg/dL
Hgb urine dipstick: NEGATIVE
Ketones, ur: NEGATIVE mg/dL
Leukocytes,Ua: NEGATIVE
Nitrite: NEGATIVE
Protein, ur: NEGATIVE mg/dL
Specific Gravity, Urine: 1.028 (ref 1.005–1.030)
pH: 5 (ref 5.0–8.0)

## 2024-08-24 LAB — LIPASE, BLOOD: Lipase: 18 U/L (ref 11–51)

## 2024-08-24 LAB — PREGNANCY, URINE: Preg Test, Ur: NEGATIVE

## 2024-08-24 MED ORDER — IOHEXOL 300 MG/ML  SOLN
100.0000 mL | Freq: Once | INTRAMUSCULAR | Status: AC | PRN
  Administered 2024-08-24: 100 mL via INTRAVENOUS

## 2024-08-24 MED ORDER — MORPHINE SULFATE (PF) 4 MG/ML IV SOLN
4.0000 mg | Freq: Once | INTRAVENOUS | Status: AC
  Administered 2024-08-24: 4 mg via INTRAVENOUS
  Filled 2024-08-24: qty 1

## 2024-08-24 MED ORDER — ALBUTEROL SULFATE HFA 108 (90 BASE) MCG/ACT IN AERS: 1.0000 | INHALATION_SPRAY | Freq: Once | RESPIRATORY_TRACT | Status: DC

## 2024-08-24 MED ORDER — LACTATED RINGERS IV BOLUS
1000.0000 mL | Freq: Once | INTRAVENOUS | Status: AC
  Administered 2024-08-24: 1000 mL via INTRAVENOUS

## 2024-08-24 MED ORDER — ONDANSETRON HCL 4 MG PO TABS: 4.0000 mg | ORAL_TABLET | Freq: Four times a day (QID) | ORAL | 0 refills | Status: DC

## 2024-08-24 MED ORDER — HYDROMORPHONE HCL 1 MG/ML IJ SOLN: 0.5000 mg | Freq: Once | INTRAMUSCULAR | Status: DC

## 2024-08-24 MED ORDER — ONDANSETRON HCL 4 MG/2ML IJ SOLN
4.0000 mg | Freq: Once | INTRAMUSCULAR | Status: AC
  Administered 2024-08-24: 4 mg via INTRAVENOUS
  Filled 2024-08-24: qty 2

## 2024-08-24 NOTE — ED Provider Notes (Signed)
 " [facility] EMERGENCY DEPARTMENT AT [facility] Provider Note   CSN: [id number] Arrival date & time: 08/24/24  1522     Patient presents with: Abdominal Pain   Alexandra Ortiz is a 44 y.o. female.  Patient is a 44 year old female with history of type 2 diabetes, hyperlipidemia, and pulmonary hypertension who presents to the ED for nausea/vomiting/diarrhea and abdominal cramping for the past 3 days.  Patient notes she got meat from a butcher and is unsure if the meat was bad or if she picked up a stomach bug somewhere.  States she is now only vomiting bile.  Notes that her symptoms were still bad last evening prompting her to come to the ED.  Notes she has not been able to eat or drink anything in 3 days.  Has not noticed any blood in the vomit or diarrhea.  Notes last menstrual cycle was 12/10.  Denies fevers, chills, headache, dizziness, syncope, chest pain, shortness of breath, urinary symptoms, vaginal discharge/bleeding.  No other complaints.    Abdominal Pain Associated symptoms: diarrhea, nausea and vomiting   Associated symptoms: no chest pain, no chills, no constipation, no dysuria, no fever and no shortness of breath        Prior to Admission medications  Medication Sig Start Date End Date Taking? Authorizing Provider  ondansetron  (ZOFRAN ) 4 MG tablet Take 1 tablet (4 mg total) by mouth every 6 (six) hours. 08/24/24  Yes Neysa Thersia RAMAN, PA-C  amoxicillin -clavulanate (AUGMENTIN ) 875-125 MG tablet Take 1 tablet by mouth every 12 (twelve) hours. 06/29/24   Leath-Warren, Etta PARAS, NP  clonazePAM (KLONOPIN) 0.5 MG tablet Take 0.5 mg by mouth 2 (two) times daily as needed for anxiety.    [provider]  Continuous Glucose Sensor (FREESTYLE LIBRE 3 SENSOR) MISC PLACE 1 SENSOR ON THE SKIN EVERY 14 DAYS AS DIRECTED. USE TO CHECK GLUCOSE CONTINUOUSLY 04/18/24   Bacchus, Gloria Z, FNP  fluticasone  (FLONASE ) 50 MCG/ACT nasal spray Place 2 sprays into both nostrils daily.  06/29/24   Leath-Warren, Etta PARAS, NP  glimepiride  (AMARYL ) 4 MG tablet Take 1 tablet (4 mg total) by mouth daily before breakfast. 02/23/24   Bacchus, Meade PEDLAR, FNP  ibuprofen  (ADVIL ) 400 MG tablet Take 400 mg by mouth every 4 (four) hours as needed. 11/29/23   [provider]  metoCLOPramide  (REGLAN ) 5 MG tablet Take 1 tablet (5 mg total) by mouth every 6 (six) hours as needed for nausea. 06/08/24   Bacchus, Meade PEDLAR, FNP  ondansetron  (ZOFRAN -ODT) 4 MG disintegrating tablet Take 4 mg by mouth every 8 (eight) hours as needed. 11/29/23   [provider]  phentermine  15 MG capsule Take 1 capsule (15 mg total) by mouth every morning. 06/08/24   Bacchus, Meade PEDLAR, FNP  promethazine -dextromethorphan (PROMETHAZINE -DM) 6.25-15 MG/5ML syrup Take 5 mLs by mouth 4 (four) times daily as needed. 06/29/24   Leath-Warren, Etta PARAS, NP  rosuvastatin  (CRESTOR ) 20 MG tablet Take 1 tablet (20 mg total) by mouth daily. 06/11/24   Bacchus, Meade PEDLAR, FNP  tirzepatide  (MOUNJARO ) 15 MG/0.5ML Pen Inject 15 mg into the skin once a week. 06/08/24   Bacchus, Meade PEDLAR, FNP  Vitamin D , Ergocalciferol , (DRISDOL ) 1.25 MG (50000 UNIT) CAPS capsule Take 1 capsule (50,000 Units total) by mouth every 7 (seven) days. 06/11/24   BacchusMeade PEDLAR, FNP    Allergies: Tape and Wound dressing adhesive    Review of Systems  Constitutional:  Negative for chills and fever.  Respiratory:  Negative  for shortness of breath.   Cardiovascular:  Negative for chest pain.  Gastrointestinal:  Positive for abdominal pain, diarrhea, nausea and vomiting. Negative for blood in stool and constipation.  Genitourinary:  Negative for dysuria.  Neurological:  Negative for dizziness, syncope and headaches.  All other systems reviewed and are negative.   Updated Vital Signs BP 124/79   Pulse 80   Temp 99.1 F (37.3 C) (Oral)   Resp 20   Ht 5' 3 (1.6 m)   Wt 98.4 kg   LMP 07/30/2024   SpO2 99%   BMI 38.44 kg/m   Physical  Exam Constitutional:      Appearance: She is well-developed.  HENT:     Head: Normocephalic and atraumatic.  Cardiovascular:     Rate and Rhythm: Normal rate.  Pulmonary:     Effort: Pulmonary effort is normal.  Abdominal:     General: Bowel sounds are normal.     Palpations: Abdomen is soft.     Tenderness: There is generalized abdominal tenderness and tenderness in the right lower quadrant.     Comments: Generalized tenderness worse in the right lower quadrant.  No masses, distention, guarding.  Skin:    General: Skin is warm and dry.  Neurological:     Mental Status: She is alert and oriented to person, place, and time.  Psychiatric:        Mood and Affect: Mood normal.        Behavior: Behavior normal.     (all labs ordered are listed, but only abnormal results are displayed) Labs Reviewed  CBC - Abnormal; Notable for the following components:      Result Value   WBC 10.9 (*)    RBC 5.71 (*)    MCV 76.2 (*)    MCH 23.3 (*)    All other components within normal limits  COMPREHENSIVE METABOLIC PANEL WITH GFR - Abnormal; Notable for the following components:   CO2 19 (*)    Glucose, Bld 139 (*)    Total Protein 8.2 (*)    AST 11 (*)    Anion gap 17 (*)    All other components within normal limits  LIPASE, BLOOD  URINALYSIS, ROUTINE W REFLEX MICROSCOPIC  PREGNANCY, URINE    EKG: None  Radiology: CT ABDOMEN PELVIS W CONTRAST Result Date: 08/24/2024 EXAM: CT ABDOMEN AND PELVIS WITH CONTRAST 08/24/2024 07:17:38 PM TECHNIQUE: CT of the abdomen and pelvis was performed with the administration of 100 mL of iohexol  (OMNIPAQUE ) 300 MG/ML solution. Multiplanar reformatted images are provided for review. Automated exposure control, iterative reconstruction, and/or weight-based adjustment of the mA/kV was utilized to reduce the radiation dose to as low as reasonably achievable. COMPARISON: None available. CLINICAL HISTORY: RLQ abdominal pain. FINDINGS: LOWER CHEST: No acute  abnormality. LIVER: 9 mm hypodensity centrally in the right hepatic lobe, likely a small cyst but difficult to characterize due to its small size. GALLBLADDER AND BILE DUCTS: Gallbladder is unremarkable. No biliary ductal dilatation. SPLEEN: No acute abnormality. PANCREAS: No acute abnormality. ADRENAL GLANDS: No acute abnormality. KIDNEYS, URETERS AND BLADDER: No stones in the kidneys or ureters. No hydronephrosis. No perinephric or periureteral stranding. Urinary bladder is unremarkable. GI AND BOWEL: Stomach demonstrates no acute abnormality. There is no bowel obstruction. Normal appendix. PERITONEUM AND RETROPERITONEUM: No ascites. No free air. VASCULATURE: Aorta is normal in caliber. LYMPH NODES: No lymphadenopathy. REPRODUCTIVE ORGANS: Enlarged fibroid uterus. The largest fibroid is in the lower uterine segment measuring up to 7.3 cm. No  adnexal mass. BONES AND SOFT TISSUES: No acute osseous abnormality. No focal soft tissue abnormality. IMPRESSION: 1. No acute findings in the abdomen or pelvis. Normal appendix. 2. Enlarged fibroid uterus. Electronically signed by: Franky Crease MD 08/24/2024 07:39 PM EST RP Workstation: [id number]     Medications Ordered in the ED  lactated ringers  bolus 1,000 mL (1,000 mLs Intravenous New Bag/Given 08/24/24 1820)  morphine  (PF) 4 MG/ML injection 4 mg (4 mg Intravenous Given 08/24/24 1820)  ondansetron  (ZOFRAN ) injection 4 mg (4 mg Intravenous Given 08/24/24 1821)  iohexol  (OMNIPAQUE ) 300 MG/ML solution 100 mL (100 mLs Intravenous Contrast Given 08/24/24 1909)                                 Medical Decision Making Patient is a 44 year old female with a history of type 2 diabetes, hyperlipidemia, and pulmonary hypertension who presents to the ED for nausea/vomiting/diarrhea and abdominal cramping for the past 3 days.  Please see detailed HPI above.  On exam patient is alert and in no acute distress.  Physical exam as noted above.  Generalized tenderness that is worse in  the right lower quadrant appreciated.  Differential includes acute viral illness, food poisoning, gastritis, IBS, IBD, cholecystitis, appendicitis.  Lab workup overall reassuring.  Mild leukocytosis of 10.9 noted but otherwise within normal limits.  Urine pregnancy is negative.  CT abdomen pelvis obtained that showed enlarged fibroid in the uterus but otherwise no acute abnormalities in the abdomen.  Suspect patient symptoms are viral in etiology.  She was given a liter of fluids and Zofran  and states she is feeling much better.  No concerns for further emergent workup at this time.  Stable for discharge home.  Vital signs stable.  Symptomatic care discussed.  Prescribe Zofran .  Advised PCP follow-up in 1 week if no improvement.  Return precautions provided.  Amount and/or Complexity of Data Reviewed Labs: ordered. Radiology: ordered.  Risk Prescription drug management.       Final diagnoses:  Right lower quadrant abdominal pain  Nausea vomiting and diarrhea    ED Discharge Orders          Ordered    ondansetron  (ZOFRAN ) 4 MG tablet  Every 6 hours        08/24/24 2103               Neysa Thersia GORMAN DEVONNA 08/24/24 2107    Suzette Pac, MD 08/26/24 1622  "

## 2024-08-24 NOTE — ED Triage Notes (Signed)
 Pt with abd pain with N/V/D x 3 days. Pt not sure if it was some meat she ate from the grocery store, pt states it started immediately after consuming it.

## 2024-08-24 NOTE — ED Notes (Signed)
 Pt/family received d/c paperwork at this time. After going over the paperwork any questions, comments, or concerns were answered to the best of this nurse's knowledge. The pt/family verbally acknowledged the teachings/instructions.

## 2024-08-24 NOTE — Discharge Instructions (Signed)
 May take Zofran  every 6-8 hours as needed for nausea/vomiting.  Stay hydrated and drink plenty of fluids.  Get plenty of rest.  Please follow-up with primary care doctor next week if no improvement.  Return to ED if any symptoms worsen including severe abdominal pain, uncontrolled nausea/vomiting, blood in the vomit or diarrhea,  New fevers.

## 2024-08-27 ENCOUNTER — Emergency Department (HOSPITAL_COMMUNITY)
Admission: EM | Admit: 2024-08-27 | Discharge: 2024-08-28 | Disposition: A | Discharge status: Home / Self Care | Attending: Emergency Medicine | Admitting: Emergency Medicine

## 2024-08-27 ENCOUNTER — Other Ambulatory Visit: Payer: Self-pay

## 2024-08-27 ENCOUNTER — Encounter (HOSPITAL_COMMUNITY): Payer: Self-pay | Admitting: Emergency Medicine

## 2024-08-27 DIAGNOSIS — E119 Type 2 diabetes mellitus without complications: Secondary | ICD-10-CM | POA: Insufficient documentation

## 2024-08-27 DIAGNOSIS — R112 Nausea with vomiting, unspecified: Secondary | ICD-10-CM | POA: Insufficient documentation

## 2024-08-27 DIAGNOSIS — E876 Hypokalemia: Secondary | ICD-10-CM | POA: Insufficient documentation

## 2024-08-27 DIAGNOSIS — R197 Diarrhea, unspecified: Secondary | ICD-10-CM | POA: Insufficient documentation

## 2024-08-27 DIAGNOSIS — Z7984 Long term (current) use of oral hypoglycemic drugs: Secondary | ICD-10-CM | POA: Diagnosis not present

## 2024-08-27 LAB — CBC
HCT: 40.6 % (ref 36.0–46.0)
Hemoglobin: 13.1 g/dL (ref 12.0–15.0)
MCH: 23.4 pg — ABNORMAL LOW (ref 26.0–34.0)
MCHC: 32.3 g/dL (ref 30.0–36.0)
MCV: 72.4 fL — ABNORMAL LOW (ref 80.0–100.0)
Platelets: 369 K/uL (ref 150–400)
RBC: 5.61 MIL/uL — ABNORMAL HIGH (ref 3.87–5.11)
RDW: 13.8 % (ref 11.5–15.5)
WBC: 9.3 K/uL (ref 4.0–10.5)
nRBC: 0 % (ref 0.0–0.2)

## 2024-08-27 LAB — COMPREHENSIVE METABOLIC PANEL WITH GFR
ALT: 10 U/L (ref 0–44)
AST: 17 U/L (ref 15–41)
Albumin: 4.1 g/dL (ref 3.5–5.0)
Alkaline Phosphatase: 77 U/L (ref 38–126)
Anion gap: 20 — ABNORMAL HIGH (ref 5–15)
BUN: 8 mg/dL (ref 6–20)
CO2: 17 mmol/L — ABNORMAL LOW (ref 22–32)
Calcium: 8.2 mg/dL — ABNORMAL LOW (ref 8.9–10.3)
Chloride: 101 mmol/L (ref 98–111)
Creatinine, Ser: 0.54 mg/dL (ref 0.44–1.00)
GFR, Estimated: >60 mL/min
Glucose, Bld: 140 mg/dL — ABNORMAL HIGH (ref 70–99)
Potassium: 2.7 mmol/L — CL (ref 3.5–5.1)
Sodium: 139 mmol/L (ref 135–145)
Total Bilirubin: 0.3 mg/dL (ref 0.0–1.2)
Total Protein: 6.8 g/dL (ref 6.5–8.1)

## 2024-08-27 LAB — URINALYSIS, ROUTINE W REFLEX MICROSCOPIC
Bilirubin Urine: NEGATIVE
Glucose, UA: NEGATIVE mg/dL
Hgb urine dipstick: NEGATIVE
Ketones, ur: 20 mg/dL — AB
Leukocytes,Ua: NEGATIVE
Nitrite: NEGATIVE
Protein, ur: 100 mg/dL — AB
Specific Gravity, Urine: 1.031 — ABNORMAL HIGH (ref 1.005–1.030)
pH: 5 (ref 5.0–8.0)

## 2024-08-27 LAB — POC URINE PREG, ED: Preg Test, Ur: NEGATIVE

## 2024-08-27 LAB — LIPASE, BLOOD: Lipase: 15 U/L (ref 11–51)

## 2024-08-27 MED ORDER — POTASSIUM CHLORIDE 10 MEQ/100ML IV SOLN
10.0000 meq | INTRAVENOUS | Status: AC
  Administered 2024-08-27 – 2024-08-28 (×2): 10 meq via INTRAVENOUS
  Filled 2024-08-27 (×2): qty 100

## 2024-08-27 MED ORDER — PROCHLORPERAZINE EDISYLATE 10 MG/2ML IJ SOLN
10.0000 mg | Freq: Once | INTRAMUSCULAR | Status: AC
  Administered 2024-08-27: 10 mg via INTRAVENOUS
  Filled 2024-08-27: qty 2

## 2024-08-27 MED ORDER — SODIUM CHLORIDE 0.9 % IV BOLUS
1000.0000 mL | Freq: Once | INTRAVENOUS | Status: AC
  Administered 2024-08-27: 1000 mL via INTRAVENOUS

## 2024-08-27 MED ORDER — POTASSIUM CHLORIDE CRYS ER 20 MEQ PO TBCR
40.0000 meq | EXTENDED_RELEASE_TABLET | Freq: Once | ORAL | Status: AC
  Administered 2024-08-27: 40 meq via ORAL
  Filled 2024-08-27: qty 2

## 2024-08-27 NOTE — ED Provider Notes (Addendum)
 " [place] EMERGENCY DEPARTMENT AT [facility] Provider Note   CSN: [id number] Arrival date & time: 08/27/24  2147     Patient presents with: Emesis   Alexandra Ortiz is a 44 y.o. female.   The history is provided by the patient.  Emesis  She has history of diabetes, hyperlipidemia, pulmonary hypertension and comes in with ongoing vomiting and diarrhea.  She started having vomiting and diarrhea 3 days ago and was seen in the emergency department and discharged with prescription for ondansetron .  Ondansetron  is not controlling the nausea.  She was actually doing better yesterday but had recurrence of severe nausea and vomiting and mild diarrhea today.  She has vomited numerous times and had 3 loose bowel movements.  Symptoms started almost immediately after eating some cubed steak 3 days ago and she is wondering whether there might be a parasite.  She denies fever but has had chills and sweats.  She has had some abdominal cramping.  She denies blood in stool or emesis.  She states that she has had about a 10 pound weight loss.    Prior to Admission medications  Medication Sig Start Date End Date Taking? Authorizing Provider  amoxicillin -clavulanate (AUGMENTIN ) 875-125 MG tablet Take 1 tablet by mouth every 12 (twelve) hours. 06/29/24   Leath-Warren, Etta PARAS, NP  clonazePAM (KLONOPIN) 0.5 MG tablet Take 0.5 mg by mouth 2 (two) times daily as needed for anxiety.    [provider]  Continuous Glucose Sensor (FREESTYLE LIBRE 3 SENSOR) MISC PLACE 1 SENSOR ON THE SKIN EVERY 14 DAYS AS DIRECTED. USE TO CHECK GLUCOSE CONTINUOUSLY 04/18/24   Bacchus, Gloria Z, FNP  fluticasone  (FLONASE ) 50 MCG/ACT nasal spray Place 2 sprays into both nostrils daily. 06/29/24   Leath-Warren, Etta PARAS, NP  glimepiride  (AMARYL ) 4 MG tablet Take 1 tablet (4 mg total) by mouth daily before breakfast. 02/23/24   Bacchus, Meade PEDLAR, FNP  ibuprofen  (ADVIL ) 400 MG tablet Take 400 mg by mouth every 4 (four)  hours as needed. 11/29/23   [provider]  metoCLOPramide  (REGLAN ) 5 MG tablet Take 1 tablet (5 mg total) by mouth every 6 (six) hours as needed for nausea. 06/08/24   Bacchus, Gloria Z, FNP  ondansetron  (ZOFRAN ) 4 MG tablet Take 1 tablet (4 mg total) by mouth every 6 (six) hours. 08/24/24   Young, Thersia RAMAN, PA-C  ondansetron  (ZOFRAN -ODT) 4 MG disintegrating tablet Take 4 mg by mouth every 8 (eight) hours as needed. 11/29/23   [provider]  phentermine  15 MG capsule Take 1 capsule (15 mg total) by mouth every morning. 06/08/24   Bacchus, Meade PEDLAR, FNP  promethazine -dextromethorphan (PROMETHAZINE -DM) 6.25-15 MG/5ML syrup Take 5 mLs by mouth 4 (four) times daily as needed. 06/29/24   Leath-Warren, Etta PARAS, NP  rosuvastatin  (CRESTOR ) 20 MG tablet Take 1 tablet (20 mg total) by mouth daily. 06/11/24   Bacchus, Gloria Z, FNP  tirzepatide  (MOUNJARO ) 15 MG/0.5ML Pen INJECT 15 MG INTO THE SKIN ONCE A WEEK. 08/27/24   Bacchus, Meade PEDLAR, FNP  Vitamin D , Ergocalciferol , (DRISDOL ) 1.25 MG (50000 UNIT) CAPS capsule Take 1 capsule (50,000 Units total) by mouth every 7 (seven) days. 06/11/24   Bacchus, Meade PEDLAR, FNP    Allergies: Tape and Wound dressing adhesive    Review of Systems  Gastrointestinal:  Positive for vomiting.  All other systems reviewed and are negative.   Updated Vital Signs BP 136/85 (BP Location: Right Arm)   Pulse 96   Temp ROLLEN)  97.3 F (36.3 C) (Temporal)   Resp 19   Ht 5' 3 (1.6 m)   Wt 98.4 kg   LMP 07/30/2024   SpO2 99%   BMI 38.44 kg/m   Physical Exam Vitals and nursing note reviewed.   44 year old female, resting comfortably and in no acute distress. Vital signs are normal. Oxygen saturation is 99%, which is normal. Head is normocephalic and atraumatic. PERRLA, EOMI. Oropharynx is clear. Lungs are clear without rales, wheezes, or rhonchi. Chest is nontender. Heart has regular rate and rhythm without murmur. Abdomen is soft, flat, with mild lower  abdominal tenderness.  There is no rebound or guarding. Extremities have no cyanosis or edema, full range of motion is present. Skin is warm and dry without rash. Neurologic: Mental status is normal, cranial nerves are intact, moves all extremities equally.  (all labs ordered are listed, but only abnormal results are displayed) Labs Reviewed  COMPREHENSIVE METABOLIC PANEL WITH GFR - Abnormal; Notable for the following components:      Result Value   Potassium 2.7 (*)    CO2 17 (*)    Glucose, Bld 140 (*)    Calcium  8.2 (*)    Anion gap 20 (*)    All other components within normal limits  CBC - Abnormal; Notable for the following components:   RBC 5.61 (*)    MCV 72.4 (*)    MCH 23.4 (*)    All other components within normal limits  URINALYSIS, ROUTINE W REFLEX MICROSCOPIC - Abnormal; Notable for the following components:   APPearance CLOUDY (*)    Specific Gravity, Urine 1.031 (*)    Ketones, ur 20 (*)    Protein, ur 100 (*)    Bacteria, UA RARE (*)    All other components within normal limits  LIPASE, BLOOD  MAGNESIUM   POC URINE PREG, ED    EKG: EKG Interpretation Date/Time:  Tuesday August 28 2024 02:37:17 EST Ventricular Rate:  85 PR Interval:  155 QRS Duration:  83 QT Interval:  383 QTC Calculation: 456 R Axis:   46  Text Interpretation: Sinus rhythm Normal ECG When compared with ECG of 02/02/2024, No significant change was found Confirmed by Raford Lenis ([id number]) on 08/28/2024 3:44:26 AM    Procedures   Medications Ordered in the ED  sodium chloride  0.9 % bolus 1,000 mL (0 mLs Intravenous Stopped 08/28/24 0214)  prochlorperazine  (COMPAZINE ) injection 10 mg (10 mg Intravenous Given 08/27/24 2318)  potassium chloride  10 mEq in 100 mL IVPB (0 mEq Intravenous Stopped 08/28/24 0130)  potassium chloride  SA (KLOR-CON  M) CR tablet 40 mEq (40 mEq Oral Given 08/27/24 2318)  magnesium  sulfate IVPB 2 g 50 mL (0 g Intravenous Stopped 08/28/24 0130)  potassium chloride  SA (KLOR-CON  M)  CR tablet 40 mEq (40 mEq Oral Given 08/28/24 0256)                                    Medical Decision Making Amount and/or Complexity of Data Reviewed Labs: ordered.  Risk Prescription drug management.   Ongoing nausea, vomiting, diarrhea.  Pattern is most suggestive of viral gastroenteritis.  If she does have a parasitic illness, I doubt it would be from the meat that she had eaten since there is a much longer incubation period with parasitic illnesses.  I have reviewed her past records and do note ED visit on 08/24/2024 for abdominal pain and vomiting and  diarrhea at which time CT of abdomen and pelvis was unremarkable.  I reviewed her laboratory tests, and my interpretation is normal WBC and hemoglobin and platelet count, moderately severe hypokalemia, high anion gap metabolic acidosis which has worsened compared with 1/2, normal transaminases, normal BUN and creatinine unchanged from 1/2.  I have ordered IV fluids, intravenous and oral potassium.  I have ordered a magnesium  level be checked.  Magnesium  level is normal.  I have also given her some additional IV magnesium .  I ordered an electrocardiogram to evaluate QT interval and I have interpreted the electrocardiogram is normal ECG with normal QT interval.  I ordered prochlorperazine  for her nausea with excellent relief of nausea.  She has tolerated oral fluids and oral potassium.  I feel she is safe for discharge.  I am discharging her with prescription for prochlorperazine  and oral potassium.  I have recommended she use over-the-counter loperamide as needed for diarrhea.  CRITICAL CARE Performed by: Alm Lias Total critical care time: 40 minutes Critical care time was exclusive of separately billable procedures and treating other patients. Critical care was necessary to treat or prevent imminent or life-threatening deterioration. Critical care was time spent personally by me on the following activities: development of treatment plan  with patient and/or surrogate as well as nursing, discussions with consultants, evaluation of patient's response to treatment, examination of patient, obtaining history from patient or surrogate, ordering and performing treatments and interventions, ordering and review of laboratory studies, ordering and review of radiographic studies, pulse oximetry and re-evaluation of patient's condition.     Final diagnoses:  Nausea vomiting and diarrhea  Hypokalemia due to excessive gastrointestinal loss of potassium    ED Discharge Orders          Ordered    prochlorperazine  (COMPAZINE ) 10 MG tablet  Every 6 hours PRN        08/28/24 0329    potassium chloride  SA (KLOR-CON  M) 20 MEQ tablet  2 times daily        08/28/24 0338               Lias Alm, MD 08/28/24 [id number]    Lias Alm, MD 08/28/24 [phone]  "

## 2024-08-27 NOTE — ED Triage Notes (Signed)
 Pt arrived to ED c/o N/V/D that started again today. Pt seen Friday for the same. Pt states she is concerned that she has a parasite.

## 2024-08-28 LAB — MAGNESIUM: Magnesium: 2 mg/dL (ref 1.7–2.4)

## 2024-08-28 MED ORDER — POTASSIUM CHLORIDE CRYS ER 20 MEQ PO TBCR: 20.0000 meq | EXTENDED_RELEASE_TABLET | Freq: Two times a day (BID) | ORAL | 0 refills | Status: AC

## 2024-08-28 MED ORDER — PROCHLORPERAZINE MALEATE 10 MG PO TABS: 10.0000 mg | ORAL_TABLET | Freq: Four times a day (QID) | ORAL | 0 refills | Status: AC | PRN

## 2024-08-28 MED ORDER — MAGNESIUM SULFATE 2 GM/50ML IV SOLN
2.0000 g | Freq: Once | INTRAVENOUS | Status: AC
  Administered 2024-08-28: 2 g via INTRAVENOUS
  Filled 2024-08-28: qty 50

## 2024-08-28 MED ORDER — POTASSIUM CHLORIDE CRYS ER 20 MEQ PO TBCR
40.0000 meq | EXTENDED_RELEASE_TABLET | Freq: Once | ORAL | Status: AC
  Administered 2024-08-28: 40 meq via ORAL
  Filled 2024-08-28: qty 2

## 2024-08-28 NOTE — Discharge Instructions (Addendum)
 Take loperamide (Imodium A-D) as needed for diarrhea.  Return to the emergency department if symptoms or not being adequately controlled at home.

## 2024-08-28 NOTE — ED Notes (Signed)
 Patient stated she was able to keep down water and crackers.

## 2024-08-31 NOTE — Telephone Encounter (Signed)
 Sent to patient.

## 2024-10-12 ENCOUNTER — Ambulatory Visit: Admitting: Family Medicine
